# Patient Record
Sex: Male | Born: 1960
Health system: Southern US, Community
[De-identification: ages and names within clinical notes are randomized; demographics above are authoritative.]

## PROBLEM LIST (undated history)

## (undated) DIAGNOSIS — K219 Gastro-esophageal reflux disease without esophagitis: Secondary | ICD-10-CM

## (undated) DIAGNOSIS — M7501 Adhesive capsulitis of right shoulder: Secondary | ICD-10-CM

## (undated) DIAGNOSIS — K648 Other hemorrhoids: Secondary | ICD-10-CM

## (undated) DIAGNOSIS — J309 Allergic rhinitis, unspecified: Secondary | ICD-10-CM

## (undated) DIAGNOSIS — E119 Type 2 diabetes mellitus without complications: Secondary | ICD-10-CM

## (undated) HISTORY — PX: HIP SURGERY: SHX245

## (undated) HISTORY — DX: Allergic rhinitis, unspecified: J30.9

## (undated) HISTORY — DX: Other hemorrhoids: K64.8

## (undated) HISTORY — DX: Gastro-esophageal reflux disease without esophagitis: K21.9

## (undated) HISTORY — PX: EYE SURGERY: SHX253

## (undated) HISTORY — DX: Type 2 diabetes mellitus without complications: E11.9

---

## 1997-08-26 ENCOUNTER — Encounter: Payer: Self-pay | Admitting: Internal Medicine

## 2002-01-22 ENCOUNTER — Ambulatory Visit (HOSPITAL_COMMUNITY): Admission: RE | Admit: 2002-01-22 | Discharge: 2002-01-22 | Payer: Self-pay | Admitting: Internal Medicine

## 2002-01-22 ENCOUNTER — Encounter: Payer: Self-pay | Admitting: Internal Medicine

## 2002-02-18 ENCOUNTER — Encounter: Payer: Self-pay | Admitting: Orthopedic Surgery

## 2002-02-18 ENCOUNTER — Ambulatory Visit (HOSPITAL_COMMUNITY): Admission: RE | Admit: 2002-02-18 | Discharge: 2002-02-18 | Payer: Self-pay | Admitting: Orthopedic Surgery

## 2002-05-18 ENCOUNTER — Encounter: Payer: Self-pay | Admitting: Orthopedic Surgery

## 2002-05-18 ENCOUNTER — Ambulatory Visit (HOSPITAL_COMMUNITY): Admission: RE | Admit: 2002-05-18 | Discharge: 2002-05-18 | Payer: Self-pay | Admitting: Orthopedic Surgery

## 2004-05-11 ENCOUNTER — Ambulatory Visit: Payer: Self-pay | Admitting: Pulmonary Disease

## 2007-04-15 ENCOUNTER — Emergency Department (HOSPITAL_COMMUNITY): Admission: EM | Admit: 2007-04-15 | Discharge: 2007-04-15 | Payer: Self-pay | Admitting: Emergency Medicine

## 2007-10-17 ENCOUNTER — Ambulatory Visit: Payer: Self-pay | Admitting: Internal Medicine

## 2007-10-17 LAB — CONVERTED CEMR LAB
BUN: 17 mg/dL (ref 6–23)
Basophils Absolute: 0 10*3/uL (ref 0.0–0.1)
Basophils Relative: 0.4 % (ref 0.0–1.0)
CO2: 29 meq/L (ref 19–32)
Calcium: 9.4 mg/dL (ref 8.4–10.5)
Chloride: 105 meq/L (ref 96–112)
Cholesterol: 242 mg/dL (ref 0–200)
Creatinine, Ser: 1.3 mg/dL (ref 0.4–1.5)
Direct LDL: 188.9 mg/dL
Eosinophils Absolute: 0.2 10*3/uL (ref 0.0–0.7)
Eosinophils Relative: 3 % (ref 0.0–5.0)
GFR calc Af Amer: 76 mL/min
GFR calc non Af Amer: 63 mL/min
Glucose, Bld: 118 mg/dL — ABNORMAL HIGH (ref 70–99)
HCT: 48.1 % (ref 39.0–52.0)
HDL: 32.7 mg/dL — ABNORMAL LOW (ref >39.0)
Hemoglobin: 15.9 g/dL (ref 13.0–17.0)
Hgb A1c MFr Bld: 6.5 % — ABNORMAL HIGH (ref 4.6–6.0)
Lymphocytes Relative: 30.2 % (ref 12.0–46.0)
MCHC: 33.1 g/dL (ref 30.0–36.0)
MCV: 86.7 fL (ref 78.0–100.0)
Monocytes Absolute: 0.4 10*3/uL (ref 0.1–1.0)
Monocytes Relative: 7.2 % (ref 3.0–12.0)
Neutro Abs: 3.4 10*3/uL (ref 1.4–7.7)
Neutrophils Relative %: 59.2 % (ref 43.0–77.0)
PSA: 0.79 ng/mL (ref 0.10–4.00)
Platelets: 209 10*3/uL (ref 150–400)
Potassium: 4 meq/L (ref 3.5–5.1)
RBC: 5.54 M/uL (ref 4.22–5.81)
RDW: 12.5 % (ref 11.5–14.6)
Sodium: 141 meq/L (ref 135–145)
TSH: 3.14 microintl units/mL (ref 0.35–5.50)
Total CHOL/HDL Ratio: 7.4
Triglycerides: 130 mg/dL (ref 0–149)
VLDL: 26 mg/dL (ref 0–40)
WBC: 5.8 10*3/uL (ref 4.5–10.5)

## 2007-10-18 ENCOUNTER — Encounter: Payer: Self-pay | Admitting: Internal Medicine

## 2008-09-17 ENCOUNTER — Ambulatory Visit: Payer: Self-pay | Admitting: Internal Medicine

## 2008-09-17 LAB — CONVERTED CEMR LAB: Hgb A1c MFr Bld: 6.1 % (ref 4.6–6.5)

## 2009-10-17 DIAGNOSIS — Z8719 Personal history of other diseases of the digestive system: Secondary | ICD-10-CM

## 2009-10-17 DIAGNOSIS — K6289 Other specified diseases of anus and rectum: Secondary | ICD-10-CM | POA: Insufficient documentation

## 2009-10-17 DIAGNOSIS — K219 Gastro-esophageal reflux disease without esophagitis: Secondary | ICD-10-CM

## 2009-10-17 DIAGNOSIS — K625 Hemorrhage of anus and rectum: Secondary | ICD-10-CM

## 2009-10-20 ENCOUNTER — Ambulatory Visit: Payer: Self-pay | Admitting: Internal Medicine

## 2009-10-31 ENCOUNTER — Ambulatory Visit: Payer: Self-pay | Admitting: Internal Medicine

## 2009-10-31 ENCOUNTER — Ambulatory Visit (HOSPITAL_COMMUNITY): Admission: RE | Admit: 2009-10-31 | Discharge: 2009-10-31 | Payer: Self-pay | Admitting: Internal Medicine

## 2010-07-14 NOTE — Letter (Signed)
Summary: [facility] Instructions  [facility]  [address] [place], [place] [address]   Phone: [phone]  Fax: [phone]       MURLIN SCHRIEBER    1960-08-24    MRN: [id number]       Procedure Day /Date: 10/31/09 Friday     Arrival Time: 11:30 am     Procedure Time: 12:30 pm     Location of Procedure:                     _x _  [facility] ( Outpatient Registration)     PREPARATION FOR COLONOSCOPY WITH MIRALAX  Starting 5 days prior to your procedure (10/26/09) do not eat nuts, seeds, popcorn, corn, beans, peas,  salads, or any raw vegetables.  Do not take any fiber supplements (e.g. Metamucil, Citrucel, and Benefiber). ____________________________________________________________________________________________________   THE DAY BEFORE YOUR PROCEDURE         DATE: 10/30/09 DAY: Thursday  1   Drink clear liquids the entire day-NO SOLID FOOD  2   Do not drink anything colored red or purple.  Avoid juices with pulp.  No orange juice.  3   Drink at least 64 oz. (8 glasses) of fluid/clear liquids during the day to prevent dehydration and help the prep work efficiently.  CLEAR LIQUIDS INCLUDE: Water Jello Ice Popsicles Tea (sugar ok, no milk/cream) Powdered fruit flavored drinks Coffee (sugar ok, no milk/cream) Gatorade Juice: apple, white grape, white cranberry  Lemonade Clear bullion, consomm, broth Carbonated beverages (any kind) Strained chicken noodle soup Hard Candy  4   Mix the entire bottle of Miralax with 64 oz. of Gatorade/Powerade in the morning and put in the refrigerator to chill.  5   At 3:00 pm take 2 Dulcolax/Bisacodyl tablets.  6   At 4:30 pm take one Reglan/Metoclopramide tablet.  7  Starting at 5:00 pm drink one 8 oz glass of the Miralax mixture every 15-20 minutes until you have finished drinking the entire 64 oz.  You should finish drinking prep around 7:30 or 8:00 pm.  8   If you are nauseated, you may take the 2nd  Reglan/Metoclopramide tablet at 6:30 pm.           9    At 8:00 pm take 2 more DULCOLAX/Bisacodyl tablets.     THE DAY OF YOUR PROCEDURE      DATE:  10/31/09 DAY: Friday  You may drink clear liquids until 10:30 am  (2 HOURS BEFORE PROCEDURE).   MEDICATION INSTRUCTIONS  Unless otherwise instructed, you should take regular prescription medications with a small sip of water as early as possible the morning of your procedure.         OTHER INSTRUCTIONS  You will need a responsible adult at least 51 years of age to accompany you and drive you home.   This person must remain in the waiting room during your procedure.  Wear loose fitting clothing that is easily removed.  Leave jewelry and other valuables at home.  However, you may wish to bring a book to read or an iPod/MP3 player to listen to music as you wait for your procedure to start.  Remove all body piercing jewelry and leave at home.  Total time from sign-in until discharge is approximately 2-3 hours.  You should go home directly after your procedure and rest.  You can resume normal activities the day after your procedure.  The day of your procedure you should not:  Drive   Make legal decisions   Operate machinery   Drink alcohol   Return to work  You will receive specific instructions about eating, activities and medications before you leave.   The above instructions have been reviewed and explained to me by   Lamona Curl CMA Duncan Dull)  Oct 20, 2009 12:45 PM     I fully understand and can verbalize these instructions _____________________________ Date 10/20/09

## 2010-07-14 NOTE — Procedures (Signed)
Summary: Instruction for procedure/[facility] (out pt)  Instruction for procedure/[facility] (out pt)   Imported By: Sherian Rein 10/23/2009 13:13:31  _____________________________________________________________________  External Attachment:    Type:   Image     Comment:   External Document

## 2010-07-14 NOTE — Procedures (Signed)
Summary: Colonoscopy  Patient: [name] Note: All result statuses are Final unless otherwise noted.  Tests: (1) Colonoscopy (COL)   COL Colonoscopy           DONE     [facility]     [address] [place], [place]  [address]           COLONOSCOPY PROCEDURE REPORT           PATIENT:  Terry, Benson  MR#:  [id number]     BIRTHDATE:  06-Feb-1961, 48 yrs. old  GENDER:  male     ENDOSCOPIST:  Hedwig Morton. Juanda Chance, MD     REF. BY:  Rosalyn Gess. Norins, M.D.     PROCEDURE DATE:  10/31/2009     PROCEDURE:  Colonoscopy [address]27[address]     ASA CLASS:  Class I     INDICATIONS:  Routine Risk Screening hematochezia, anoscopic exam     confirms first grade hemmorrhoids     MEDICATIONS:   Versed 5 mg, Fentanyl 75 mcg           DESCRIPTION OF PROCEDURE:   After the risks benefits and     alternatives of the procedure were thoroughly explained, informed     consent was obtained.  Digital rectal exam was performed and     revealed no abnormalities.   The  endoscope was introduced through     the anus and advanced to the cecum, which was identified by both     the appendix and ileocecal valve, without limitations.  The     quality of the prep was excellent, using MiraLax.  The instrument     was then slowly withdrawn as the colon was fully examined.     <<PROCEDUREIMAGES>>           FINDINGS:  Internal hemorrhoids were found (see image11, image12,     image13, and image14). small first grade hems x 3, also tiny     erosions within anal canal, no definite fissure, no prolapse  Mild     diverticulosis was found (see image9 and image2).  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 2 hemangiomas, no stigmata of bleeding  This was     otherwise a normal examination of the colon (see image6, image5,     image4, and image3).   Retroflexed views in the rectum revealed no     abnormalities.    The scope was then withdrawn from the patient     and the  procedure completed.           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           ______________________________     Hedwig Morton. Juanda Chance,  MD           CC:           n.     eSIGNED:   Ariadne Rissmiller M. Anisa Benson at 10/31/2009 01:16 PM           Ancelmo, Hunt, [id number]  Note: An exclamation mark (!) indicates a result that was not dispersed into the flowsheet. Document Creation Date: 10/31/2009 1:54 PM _______________________________________________________________________  (1) Order result status: Final Collection or observation date-time: 10/31/2009 13:06 Requested date-time:  Receipt date-time:  Reported date-time:  Referring Physician:   Ordering Physician: Lina Sar [phone]) Specimen Source:  Source: Launa Grill Order Number: [id number] Lab site:   Appended Document: Colonoscopy Recall is in IDX for 10/2019.

## 2010-07-14 NOTE — Assessment & Plan Note (Signed)
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 He has had a one year history of rectal bleeding and rectal pain which is worse when sitting and sometimes when he is having a bowel movement. He has some difficulty with evacuation. He is very active physically not limited to work but he also bikes. He is  concerned about the possibly of hemorrhoids, anal fissure or possibly coccydynia. He has a tearing sensation with bowel movements. There is no family history of colon cancer. He has never had a colonoscopy. He denies any abdominal pain. Patient does have a history significant for gastroesophageal reflux and Grade 2 erosive  esophagitis initially evaluated in 1999. Since his intentional weight loss, the reflux symptoms have resolved and he is currently on no acid reducing agents.   GI Review of Systems      Denies abdominal pain, acid reflux, belching, bloating, chest pain, dysphagia with liquids, dysphagia with solids, heartburn, loss of appetite, nausea, vomiting, vomiting blood, weight loss, and  weight gain.      Reports change in bowel habits, constipation, hemorrhoids, and  rectal bleeding.     Denies anal fissure, black tarry stools, diarrhea, diverticulosis, fecal incontinence, heme positive stool, irritable bowel syndrome, jaundice, light color stool, liver problems, and  rectal pain.    Current Medications (verified): 1)  None  Allergies (verified): No Known Drug Allergies  Past History:  Past Medical History: Current Problems:  REFLUX ESOPHAGITIS, HX OF (ICD-V12.79) GERD (ICD-530.81) RECTAL PAIN (ICD-569.42) RECTAL BLEEDING (ICD-569.3)  Hyperlipidemia  Past Surgical History: Left Hip surgery Eye Surgery Left x 2  Family History: Family History of Colon Cancer: MGF-dx'd  in 70's, Pat Aunt dx'd/deceased in 25's  Social History: Married, 1 boy, 1 girl Occupation: Producer, television/film/video Patient has never smoked.  Illicit Drug Use - no Daily Caffeine Use 3 cups/coffee  Review of Systems       The patient complains of back pain.  The patient denies allergy/sinus, anemia, anxiety-new, arthritis/joint pain, blood in urine, breast changes/lumps, confusion, cough, coughing up blood, depression-new, fainting, fatigue, fever, headaches-new, hearing problems, heart murmur, heart rhythm changes, itching, muscle pains/cramps, night sweats, nosebleeds, shortness of breath, skin rash, sleeping problems, sore throat, swelling of feet/legs, swollen lymph glands, thirst - excessive, urination - excessive, urination changes/pain, urine leakage, vision changes, and voice change.         Pertinent positive and negative review of systems were noted in the above HPI. All other ROS was otherwise negative.   Vital Signs:  Patient profile:   50 year old male Height:      69 inches Weight:      187 pounds BMI:     27.71 Pulse rate:   68 / minute Pulse rhythm:   regular BP sitting:   104 / 62  (left arm) Cuff size:   regular  Vitals Entered By: Francee Piccolo CMA Duncan Dull) (Oct 20, 2009 12:54 PM)  Physical Exam  General:  Well developed, well nourished, no acute distress. Lungs:  Clear throughout to auscultation. Heart:  Regular rate and rhythm; no murmurs, rubs,  or bruits. Abdomen:  Soft, nontender and nondistended. No masses, hepatosplenomegaly or hernias noted. Normal bowel sounds. Rectal:  rectal and anoscopic exam reveals a normal perianal area with a small skin tag protruding at  7:00 the anal   canal is normal. Rectal tone is unremarkable. There were circumferential ,first-grade hemorrhoids, specifically one at 3:00 which is hyperemic, bluish and quite edematous. There was no prolapse of the hemorrhoids. The mucosa of the rectal ampulla appears normal. Stool  is Hemoccult negative. Coccyx was slightly tender. Extremities:  No clubbing, cyanosis, edema or deformities noted. Skin:  Intact without significant lesions or rashes. Psych:  Alert and cooperative. Normal mood and affect.   Impression & Recommendations:  Problem # 1:  RECTAL PAIN (ZOX-096.04)  Patient's rectal pain is most likely associated with symptomatic first grade internal  hemorrhoids, there is no evidence of anal fissure. There is some tenderness of the coccyx as well and this could be due to rectal spasm. Because of the intermittent bleeding and his age of 89 we will proceed with a colonoscopy and depending on the assessment, we would also consider banding of the hemorrhoids. In the meantime, he will use Anusol-HC suppositories once a day and Analpram cream p.r.n. rectal irritation. He may also use dicolfenac 75 mg p.r.n. rectal and coccygeal discomfort and stool softeners p.r.n.  Orders: ZCOL Banding (ZCOL Band)  Problem # 2:  REFLUX ESOPHAGITIS, HX OF (ICD-V12.79) Patient is currently asymptomatic since his massive weight loss. He is on no medications at this time.  Patient Instructions: 1)  You have been scheduled for a colonoscopy at [facility] registration. Please arrive at 11:30 am for your 12:30 pm appointment. 2)  Please pick up your Miralax prep and anusol suppositories at the pharmacy. We have already sent prescriptions electronically for you. 3)  A hemorrhoid handout has been given to you. 4)  The medication list was reviewed and reconciled.  All changed / newly prescribed medications were explained.  A complete medication list was provided to the patient / caregiver. Prescriptions: DICLOFENAC SODIUM 75 MG TBEC (DICLOFENAC SODIUM) Take 1 tablet by mouth once daily  #30 x 3   Entered by:   Lamona Curl CMA (AAMA)   Authorized by:   Hart Carwin MD   Signed by:   Lamona Curl CMA (AAMA) on 10/20/2009   Method used:   Electronically to         [facility] [address] [phone]* (retail)       [address]       [place], [place]  [address]       Ph: [phone] or [phone]       Fax: [phone]   RxID:   [id number] ANUSOL-HC 25 MG SUPP (HYDROCORTISONE ACETATE) Insert 1 suppository into the rectum at bedtime  #12 x 3   Entered by:   Lamona Curl CMA (AAMA)   Authorized by:   Hart Carwin MD   Signed by:   Lamona Curl CMA (AAMA) on 10/20/2009   Method used:   Electronically to        [facility] [address] [phone]* (retail)       [address]       [place], [place]  [address]       Ph: [phone] or [phone]       Fax: [phone]   RxID:   [id number] REGLAN 10 MG  TABS (METOCLOPRAMIDE HCL) As per prep instructions.  #2 x 0   Entered by:   Lamona Curl CMA (AAMA)   Authorized by:   Hart Carwin MD  Signed by:   Lamona Curl CMA (AAMA) on 10/20/2009   Method used:   Electronically to        [facility] [address] [phone]* (retail)       [address]       [place], [place]  [address]       Ph: [phone] or [phone]       Fax: [phone]   RxID:   [id number] DULCOLAX 5 MG  TBEC (BISACODYL) Day before procedure take 2 at 3pm and 2 at 8pm.  #4 x 0   Entered by:   Lamona Curl CMA (AAMA)   Authorized by:   Hart Carwin MD   Signed by:   Lamona Curl CMA (AAMA) on 10/20/2009   Method used:   Electronically to        [facility] [address] [phone]* (retail)       [address]       [place], [place]  [address]       Ph: [phone] or [phone]       Fax: [phone]   RxID:   [id number]

## 2010-07-14 NOTE — Procedures (Signed)
Summary: EGD/[facility]  EGD/[facility]   Imported By: Sherian Rein 10/20/2009 09:46:17  _____________________________________________________________________  External Attachment:    Type:   Image     Comment:   External Document

## 2010-10-30 NOTE — Op Note (Signed)
NAMECHAUN, UEMURA NO.:  [id number]   MEDICAL RECORD NO.:  [id number]                   PATIENT TYPE:  OIB   LOCATION:  2899                                 FACILITY:  [facility]   PHYSICIAN:  Ollen Gross, M.D.                 DATE OF BIRTH:  1960/10/22   DATE OF PROCEDURE:  05/18/2002  DATE OF DISCHARGE:                                 OPERATIVE REPORT   PREOPERATIVE DIAGNOSES:  Left hip labral tear.   POSTOPERATIVE DIAGNOSES:  Left hip labral tear.  Chondral defect of the  acetabulum.   PROCEDURE:  Left hip arthroscopy with labral debridement and chondroplasty.   SURGEON:  Ollen Gross, M.D.   ASSISTANT:  Alexzandrew L. Julien Girt, P.A.   ANESTHESIA:  General.   ESTIMATED BLOOD LOSS:  Minimal.   DRAINS:  None.   COMPLICATIONS:  None.   DISPOSITION:  To recovery.   CLINICAL NOTE:  Terry Benson is a 50 year old male with a several month history of  significant left hip pain and mechanical-type symptoms. He gets a catching  in his groin with external rotation.  Exam and history suggests a labral  tear confirmed by MRI.  He presents now for hip arthroscopy and labral  debridement.   PROCEDURE IN DETAIL:  After successful administration of general anesthetic,  the patient was placed in the right lateral decubitus position with the left  side up with his lower leg well-padded.  The peroneal post is then placed,  and the peroneal post was well-padded.  The left lower extremity was draped  over the perineal post and the left foot well-padded and placed into the  traction boot.  Under fluoroscopic guidance, traction was applied until the  appropriate amount of distraction was present across the hip joint.  The hip  was then prepped and draped in the usual sterile fashion.  The spinal  needles were used to localize the anterior and posterior peritrochanteric  portals.  Once those were identified and were found to be in the joint, then  saline was  injected through the posterior needle and shown to egress through  the anterior needle, confirming that they were both intra-articular.  Fluoro  was then removed.  A wire was then passed through the posterior needle.  A  small incision was made around the wire.  A 5 mm dilator was placed over the  wire and then a 5 mm cannula was placed.  The camera was then passed into  the joint.  Arthroscopic visualization proceeded.  The fovea appeared  normal.  The femoral head appeared normal throughout.  The posterior and  superior acetabulum all looked normal.  Along the anterior acetabulum, there  appears to be in a wrinkle in the cartilage at the chondral labral junction.  There was also a small tear in the anterior-inferior labrum.  This  corresponds to the area of abnormality on  the MRI.  The needle anteriorly  was found to be in good position, and we then placed the wire.  We removed  the needle and made a small incision and put the 5 and 7 mm dilators and 7  mm cannula anteriorly.  A probe was placed, and he had a chondral  delamination from the anterior-inferior acetabulum, about a 1 x 2 cm lesion.  This was debrided back to a stable bony base with the long shaver and the  ArthroCare.  We probed again, and everything was found to be stable.  The  labral tear was also debrided and stabilized. The joints were again  reinspected, and there were no loose bodies.  Once the debridement was  completed, then the arthroscopic instruments were removed from the posterior  portal, and 20 cc of 0.25% Marcaine with epinephrine were injected through  the cannula, which was subsequently removed.  Traction was released, and the  hip was confirmed to be reduced with a fluoro spot.  The incision was then  closed with interrupted 4-0 nylon.  A bulky, sterile dressing was applied.  The incision was then closed with interrupted 4-0 nylon.  A bulky, sterile  dressing was applied.  He was awakened and transported to  recovery in a  stable condition.                                               Ollen Gross, M.D.    FA/MEDQ  D:  05/18/2002  T:  05/19/2002  Job:  [id number]

## 2011-02-24 ENCOUNTER — Ambulatory Visit: Payer: Self-pay | Admitting: *Deleted

## 2012-05-08 ENCOUNTER — Ambulatory Visit (HOSPITAL_COMMUNITY)
Admission: RE | Admit: 2012-05-08 | Discharge: 2012-05-08 | Disposition: A | Discharge status: Home / Self Care | Payer: 59 | Source: Ambulatory Visit | Attending: Neurosurgery | Admitting: Neurosurgery

## 2012-05-08 ENCOUNTER — Other Ambulatory Visit (HOSPITAL_COMMUNITY): Payer: Self-pay | Admitting: Neurosurgery

## 2012-05-08 DIAGNOSIS — M542 Cervicalgia: Secondary | ICD-10-CM

## 2013-07-23 ENCOUNTER — Other Ambulatory Visit: Payer: Self-pay | Admitting: *Deleted

## 2013-07-23 DIAGNOSIS — Z0000 Encounter for general adult medical examination without abnormal findings: Secondary | ICD-10-CM

## 2013-07-25 ENCOUNTER — Other Ambulatory Visit (INDEPENDENT_AMBULATORY_CARE_PROVIDER_SITE_OTHER): Payer: 59

## 2013-07-25 DIAGNOSIS — Z0000 Encounter for general adult medical examination without abnormal findings: Secondary | ICD-10-CM

## 2013-07-25 LAB — HEPATIC FUNCTION PANEL
ALBUMIN: 4.5 g/dL (ref 3.5–5.2)
ALT: 22 U/L (ref 0–53)
AST: 22 U/L (ref 0–37)
Alkaline Phosphatase: 64 U/L (ref 39–117)
Bilirubin, Direct: 0.1 mg/dL (ref 0.0–0.3)
Total Bilirubin: 1 mg/dL (ref 0.3–1.2)
Total Protein: 7 g/dL (ref 6.0–8.3)

## 2013-07-25 LAB — CBC WITH DIFFERENTIAL/PLATELET
BASOS PCT: 0.2 % (ref 0.0–3.0)
Basophils Absolute: 0 10*3/uL (ref 0.0–0.1)
EOS ABS: 0.1 10*3/uL (ref 0.0–0.7)
Eosinophils Relative: 1.8 % (ref 0.0–5.0)
HEMATOCRIT: 48.8 % (ref 39.0–52.0)
HEMOGLOBIN: 16 g/dL (ref 13.0–17.0)
LYMPHS ABS: 1.4 10*3/uL (ref 0.7–4.0)
LYMPHS PCT: 19.3 % (ref 12.0–46.0)
MCHC: 32.8 g/dL (ref 30.0–36.0)
MCV: 88.4 fl (ref 78.0–100.0)
MONO ABS: 0.6 10*3/uL (ref 0.1–1.0)
Monocytes Relative: 8 % (ref 3.0–12.0)
NEUTROS ABS: 5.3 10*3/uL (ref 1.4–7.7)
Neutrophils Relative %: 70.7 % (ref 43.0–77.0)
Platelets: 222 10*3/uL (ref 150.0–400.0)
RBC: 5.52 Mil/uL (ref 4.22–5.81)
RDW: 13.8 % (ref 11.5–14.6)
WBC: 7.5 10*3/uL (ref 4.5–10.5)

## 2013-07-25 LAB — BASIC METABOLIC PANEL
BUN: 21 mg/dL (ref 6–23)
CALCIUM: 9.5 mg/dL (ref 8.4–10.5)
CHLORIDE: 109 meq/L (ref 96–112)
CO2: 26 mEq/L (ref 19–32)
CREATININE: 1.4 mg/dL (ref 0.4–1.5)
GFR: 55.34 mL/min — ABNORMAL LOW (ref >60.00)
Glucose, Bld: 112 mg/dL — ABNORMAL HIGH (ref 70–99)
Potassium: 4.2 mEq/L (ref 3.5–5.1)
Sodium: 142 mEq/L (ref 135–145)

## 2013-07-25 LAB — LIPID PANEL
CHOL/HDL RATIO: 6
CHOLESTEROL: 233 mg/dL — AB (ref 0–200)
HDL: 42.3 mg/dL (ref >39.00)
Triglycerides: 99 mg/dL (ref 0.0–149.0)
VLDL: 19.8 mg/dL (ref 0.0–40.0)

## 2013-07-25 LAB — LDL CHOLESTEROL, DIRECT: Direct LDL: 180.7 mg/dL

## 2013-07-25 LAB — PSA: PSA: 0.74 ng/mL (ref 0.10–4.00)

## 2013-07-25 LAB — HEMOGLOBIN A1C: Hgb A1c MFr Bld: 6.3 % (ref 4.6–6.5)

## 2013-08-24 ENCOUNTER — Ambulatory Visit (INDEPENDENT_AMBULATORY_CARE_PROVIDER_SITE_OTHER): Payer: 59 | Admitting: Internal Medicine

## 2013-08-24 VITALS — BP 120/88 | Resp 12 | Wt 199.0 lb

## 2013-08-24 DIAGNOSIS — H6983 Other specified disorders of Eustachian tube, bilateral: Secondary | ICD-10-CM

## 2013-08-24 DIAGNOSIS — Z789 Other specified health status: Secondary | ICD-10-CM

## 2013-08-24 DIAGNOSIS — F17200 Nicotine dependence, unspecified, uncomplicated: Secondary | ICD-10-CM

## 2013-08-24 DIAGNOSIS — K625 Hemorrhage of anus and rectum: Secondary | ICD-10-CM

## 2013-08-24 DIAGNOSIS — H6990 Unspecified Eustachian tube disorder, unspecified ear: Secondary | ICD-10-CM

## 2013-08-24 DIAGNOSIS — J309 Allergic rhinitis, unspecified: Secondary | ICD-10-CM

## 2013-08-24 DIAGNOSIS — J329 Chronic sinusitis, unspecified: Secondary | ICD-10-CM

## 2013-08-24 DIAGNOSIS — H6980 Other specified disorders of Eustachian tube, unspecified ear: Secondary | ICD-10-CM

## 2013-08-24 DIAGNOSIS — Z8719 Personal history of other diseases of the digestive system: Secondary | ICD-10-CM

## 2013-08-24 DIAGNOSIS — H8309 Labyrinthitis, unspecified ear: Secondary | ICD-10-CM

## 2013-08-24 DIAGNOSIS — H6993 Unspecified Eustachian tube disorder, bilateral: Secondary | ICD-10-CM

## 2013-08-24 DIAGNOSIS — Z720 Tobacco use: Secondary | ICD-10-CM

## 2013-08-24 NOTE — Patient Instructions (Addendum)
Good to see you.  Chronic rhinitis - allergic in nature. Plan Trial of nasal inhalational steroids - sample of nasonex 2 sprays to each nostril once a day. If this works you can use otc Flonase 1 or 2 sprays once a day. After you have been on this for a while you can drop back to 3 times a week. Be sure to gargle after use of the spray   Labyrinthitis  - your symptoms a more consistent with this diagnosis, especially since your respond to antivert.  Chronic eustachian tube dysfunction - consult with ENT, may need myringotomy tubes.  Health maintenance - please avoid nicotine. Check with Sydell Axon about EGD. Good for colonoscopy until 2021. Immunizations - consider Tdap, ? Pneumonia vaccines: prevnar first then pneumovax after 8 weeks.

## 2013-08-26 ENCOUNTER — Encounter: Payer: Self-pay | Admitting: Internal Medicine

## 2013-08-26 DIAGNOSIS — J329 Chronic sinusitis, unspecified: Secondary | ICD-10-CM | POA: Insufficient documentation

## 2013-08-26 DIAGNOSIS — H8309 Labyrinthitis, unspecified ear: Secondary | ICD-10-CM | POA: Insufficient documentation

## 2013-08-26 DIAGNOSIS — J309 Allergic rhinitis, unspecified: Secondary | ICD-10-CM | POA: Insufficient documentation

## 2013-08-26 DIAGNOSIS — H6983 Other specified disorders of Eustachian tube, bilateral: Secondary | ICD-10-CM | POA: Insufficient documentation

## 2013-08-26 DIAGNOSIS — Z789 Other specified health status: Secondary | ICD-10-CM

## 2013-08-26 DIAGNOSIS — Z720 Tobacco use: Secondary | ICD-10-CM | POA: Insufficient documentation

## 2013-08-26 NOTE — Assessment & Plan Note (Signed)
Several episodes over the last year of dysequilibrium, at times with nausea, that has responded to meclizine. No focal neurologic symptoms  Plan No indication for neuro imaging  Continue meclizine prn  For failure to respond or worsening symptoms - neuro imaging and vestibular rehab.

## 2013-08-26 NOTE — Progress Notes (Signed)
   Subjective:    Patient ID: Terry Benson, male    DOB: 01-09-1961, 53 y.o.   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 He feels that his ears are pressurized a majority of the time. He has had only partial relief with oral decongestants. He is taking claritin on a frequent basis for symptoms c/w allergic rhinitis.  He reports that he has had several episodes of vertigo with nausea. This can be close to debilitating. On questioning he endorses that his symptoms are more of a dysequilibrium rather than true vertigo. He has taken meclizine with good results.  He uses oral tobacco products on occasion and has noted a palpable abnormality in the left sub-mental area.  He has had a history of GERD and is concerned that he is overdue for follow up EGD to rule out Barrett's esophagus.  Past Medical History  Diagnosis Date  . GERD (gastroesophageal reflux disease)   . Internal hemorrhoid     by colonoscopy 2011  . Allergic rhinitis    Past Surgical History  Procedure Laterality Date  . Eye surgery      x 2  . Hip surgery     Family History  Problem Relation Age of Onset  . Cancer Paternal Aunt     colon  . Cancer Maternal Grandfather     colon   History   Social History  . Marital Status: Married    Spouse Name: N/A    Number of Children: N/A  . Years of Education: N/A   Occupational History  . Not on file.   Social History Main Topics  . Smoking status: Never Smoker   . Smokeless tobacco: Current User  . Alcohol Use: No  . Drug Use: Not on file  . Sexual Activity: Yes    Partners: Female   Other Topics Concern  . 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   Incomplete history - needs fleshing out at next PCP encounter.   No current outpatient prescriptions on file prior to visit.   No current  facility-administered medications on file prior to visit.   OTC use - claritin frequently  Sudafed as needed.      Review of Systems System review is negative for any constitutional, cardiac, pulmonary, GI or neuro symptoms or complaints other than as described in the HPI.      Objective:   Physical Exam Filed Vitals:   08/24/13 1218  BP: 120/88  Resp: 12   Gen'l- WNWD man in no distress HEENT - EACs clear, TMs appear normal Nodes - no submandibular or submental node(s). Linear firmness that is more tendon like. Neuro - Awake and alert, normal cognition.       Assessment & Plan:

## 2013-08-26 NOTE — Assessment & Plan Note (Signed)
H/o erosive esophagitis. On no medications but concerned about possible progression to Barrett's.  Plan He will contact Dr. Olevia Perches in regard to follow up study.

## 2013-08-26 NOTE — Assessment & Plan Note (Signed)
Chronic sinus pressure/congestion but no purulent rhinorrhea, fever or sinus pain.  Plan ENT consultation.

## 2013-08-26 NOTE — Assessment & Plan Note (Signed)
Based on history of symptoms with drainage, congestion w/o infection.  Plan Nasal inhalational steroid spray - starting with sample of Nasonex and if helpful changing to otc fluticasone.

## 2013-08-26 NOTE — Assessment & Plan Note (Signed)
History of symptoms very c/w increased middle ear pressure 2/2 eustachian tube dysfunction. Oral decongestants offer only partial relief.  Plan ENT consult

## 2013-08-26 NOTE — Assessment & Plan Note (Signed)
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 He is aware of the risks of nicotine use.  Plan He is encouraged to d/c use but he is only contemplative at this time, not prepared to stop.

## 2013-09-07 ENCOUNTER — Telehealth: Payer: Self-pay | Admitting: Internal Medicine

## 2013-09-07 NOTE — Telephone Encounter (Signed)
Rec'd from [facility] forward 3 pages to Dr. Norins °

## 2013-09-11 ENCOUNTER — Telehealth: Payer: Self-pay | Admitting: Internal Medicine

## 2013-09-11 NOTE — Telephone Encounter (Signed)
Rec'd from [facility] forward 4 pages to Dr.norins

## 2013-09-11 NOTE — Telephone Encounter (Signed)
Rec'd from [facility] forward 4 pages to Dr.Norins ° °

## 2013-10-15 ENCOUNTER — Encounter: Payer: Self-pay | Admitting: Cardiovascular Disease

## 2014-02-22 ENCOUNTER — Ambulatory Visit: Payer: 59 | Attending: Cardiovascular Disease | Admitting: Rehabilitative and Restorative Service Providers"

## 2014-02-22 DIAGNOSIS — IMO0001 Reserved for inherently not codable concepts without codable children: Secondary | ICD-10-CM | POA: Insufficient documentation

## 2014-02-22 DIAGNOSIS — H8110 Benign paroxysmal vertigo, unspecified ear: Secondary | ICD-10-CM | POA: Diagnosis not present

## 2014-02-27 ENCOUNTER — Encounter: Payer: 59 | Admitting: Rehabilitative and Restorative Service Providers"

## 2014-12-05 ENCOUNTER — Other Ambulatory Visit (HOSPITAL_COMMUNITY): Payer: Self-pay | Admitting: Orthopedic Surgery

## 2014-12-05 DIAGNOSIS — M25511 Pain in right shoulder: Secondary | ICD-10-CM

## 2014-12-17 ENCOUNTER — Ambulatory Visit (HOSPITAL_COMMUNITY)
Admission: RE | Admit: 2014-12-17 | Discharge: 2014-12-17 | Disposition: A | Discharge status: Home / Self Care | Payer: 59 | Source: Ambulatory Visit | Attending: Orthopedic Surgery | Admitting: Orthopedic Surgery

## 2014-12-17 DIAGNOSIS — M25511 Pain in right shoulder: Secondary | ICD-10-CM

## 2014-12-17 DIAGNOSIS — M7581 Other shoulder lesions, right shoulder: Secondary | ICD-10-CM | POA: Insufficient documentation

## 2014-12-17 DIAGNOSIS — M24011 Loose body in right shoulder: Secondary | ICD-10-CM | POA: Diagnosis not present

## 2014-12-17 MED ORDER — IOHEXOL 180 MG/ML  SOLN: 20.0000 mL | Freq: Once | INTRAMUSCULAR | Status: AC | PRN

## 2014-12-17 MED ORDER — LIDOCAINE HCL (PF) 1 % IJ SOLN: 2.0000 mL | Freq: Once | INTRAMUSCULAR | Status: DC

## 2014-12-17 MED ORDER — GADOBENATE DIMEGLUMINE 529 MG/ML IV SOLN: 5.0000 mL | Freq: Once | INTRAVENOUS | Status: AC | PRN

## 2015-01-08 ENCOUNTER — Other Ambulatory Visit: Payer: Self-pay | Admitting: *Deleted

## 2015-01-08 MED ORDER — TRAMADOL HCL 50 MG PO TABS: 50.0000 mg | ORAL_TABLET | Freq: Four times a day (QID) | ORAL | Status: DC | PRN

## 2015-01-08 MED ORDER — PREDNISONE 10 MG (21) PO TBPK: 10.0000 mg | ORAL_TABLET | Freq: Every day | ORAL | Status: DC

## 2015-06-04 ENCOUNTER — Encounter (HOSPITAL_BASED_OUTPATIENT_CLINIC_OR_DEPARTMENT_OTHER): Payer: Self-pay | Admitting: *Deleted

## 2015-06-06 ENCOUNTER — Ambulatory Visit (HOSPITAL_BASED_OUTPATIENT_CLINIC_OR_DEPARTMENT_OTHER): Payer: 59 | Admitting: Anesthesiology

## 2015-06-06 ENCOUNTER — Encounter (HOSPITAL_BASED_OUTPATIENT_CLINIC_OR_DEPARTMENT_OTHER)
Admission: RE | Disposition: A | Discharge status: Home / Self Care | Payer: Self-pay | Source: Ambulatory Visit | Attending: Orthopedic Surgery

## 2015-06-06 ENCOUNTER — Encounter (HOSPITAL_BASED_OUTPATIENT_CLINIC_OR_DEPARTMENT_OTHER): Payer: Self-pay | Admitting: Anesthesiology

## 2015-06-06 ENCOUNTER — Ambulatory Visit (HOSPITAL_BASED_OUTPATIENT_CLINIC_OR_DEPARTMENT_OTHER)
Admission: RE | Admit: 2015-06-06 | Discharge: 2015-06-06 | Disposition: A | Discharge status: Home / Self Care | Payer: 59 | Source: Ambulatory Visit | Attending: Orthopedic Surgery | Admitting: Orthopedic Surgery

## 2015-06-06 DIAGNOSIS — S43491A Other sprain of right shoulder joint, initial encounter: Secondary | ICD-10-CM | POA: Diagnosis present

## 2015-06-06 DIAGNOSIS — W19XXXA Unspecified fall, initial encounter: Secondary | ICD-10-CM | POA: Insufficient documentation

## 2015-06-06 DIAGNOSIS — K219 Gastro-esophageal reflux disease without esophagitis: Secondary | ICD-10-CM | POA: Insufficient documentation

## 2015-06-06 DIAGNOSIS — M7501 Adhesive capsulitis of right shoulder: Secondary | ICD-10-CM | POA: Diagnosis present

## 2015-06-06 HISTORY — PX: SHOULDER ARTHROSCOPY: SHX128

## 2015-06-06 HISTORY — DX: Adhesive capsulitis of right shoulder: M75.01

## 2015-06-06 SURGERY — ARTHROSCOPY, SHOULDER
Anesthesia: General | Site: Shoulder | Laterality: Right

## 2015-06-06 MED ORDER — ONDANSETRON HCL 4 MG/2ML IJ SOLN
INTRAMUSCULAR | Status: DC | PRN
  Administered 2015-06-06: 4 mg via INTRAVENOUS

## 2015-06-06 MED ORDER — OXYCODONE-ACETAMINOPHEN 5-325 MG PO TABS: 1.0000 | ORAL_TABLET | Freq: Four times a day (QID) | ORAL | Status: DC | PRN

## 2015-06-06 MED ORDER — MIDAZOLAM HCL 2 MG/2ML IJ SOLN
INTRAMUSCULAR | Status: AC
  Filled 2015-06-06: qty 2

## 2015-06-06 MED ORDER — LIDOCAINE HCL (CARDIAC) 20 MG/ML IV SOLN
INTRAVENOUS | Status: AC
  Filled 2015-06-06: qty 5

## 2015-06-06 MED ORDER — CEFAZOLIN SODIUM-DEXTROSE 2-3 GM-% IV SOLR
2.0000 g | INTRAVENOUS | Status: AC
  Administered 2015-06-06: 2 g via INTRAVENOUS

## 2015-06-06 MED ORDER — GLYCOPYRROLATE 0.2 MG/ML IJ SOLN: 0.2000 mg | Freq: Once | INTRAMUSCULAR | Status: DC | PRN

## 2015-06-06 MED ORDER — DEXAMETHASONE SODIUM PHOSPHATE 10 MG/ML IJ SOLN
INTRAMUSCULAR | Status: AC
  Filled 2015-06-06: qty 1

## 2015-06-06 MED ORDER — FENTANYL CITRATE (PF) 100 MCG/2ML IJ SOLN
INTRAMUSCULAR | Status: DC | PRN
  Administered 2015-06-06: 100 ug via INTRAVENOUS

## 2015-06-06 MED ORDER — MEPERIDINE HCL 25 MG/ML IJ SOLN: 6.2500 mg | INTRAMUSCULAR | Status: DC | PRN

## 2015-06-06 MED ORDER — SCOPOLAMINE 1 MG/3DAYS TD PT72: 1.0000 | MEDICATED_PATCH | Freq: Once | TRANSDERMAL | Status: DC | PRN

## 2015-06-06 MED ORDER — HYDROMORPHONE HCL 1 MG/ML IJ SOLN
0.2500 mg | INTRAMUSCULAR | Status: DC | PRN
  Administered 2015-06-06 (×2): 0.5 mg via INTRAVENOUS

## 2015-06-06 MED ORDER — ONDANSETRON HCL 4 MG/2ML IJ SOLN
INTRAMUSCULAR | Status: AC
  Filled 2015-06-06: qty 2

## 2015-06-06 MED ORDER — LIDOCAINE HCL (CARDIAC) 20 MG/ML IV SOLN
INTRAVENOUS | Status: DC | PRN
  Administered 2015-06-06: 100 mg via INTRAVENOUS

## 2015-06-06 MED ORDER — SODIUM CHLORIDE 0.9 % IR SOLN
Status: DC | PRN
  Administered 2015-06-06: 3000 mL

## 2015-06-06 MED ORDER — OXYCODONE HCL 5 MG PO TABS
ORAL_TABLET | ORAL | Status: AC
  Filled 2015-06-06: qty 1

## 2015-06-06 MED ORDER — SUCCINYLCHOLINE CHLORIDE 20 MG/ML IJ SOLN
INTRAMUSCULAR | Status: AC
  Filled 2015-06-06: qty 1

## 2015-06-06 MED ORDER — SENNA-DOCUSATE SODIUM 8.6-50 MG PO TABS: 2.0000 | ORAL_TABLET | Freq: Every day | ORAL | Status: DC

## 2015-06-06 MED ORDER — DEXAMETHASONE SODIUM PHOSPHATE 4 MG/ML IJ SOLN
INTRAMUSCULAR | Status: DC | PRN
  Administered 2015-06-06: 10 mg via INTRAVENOUS

## 2015-06-06 MED ORDER — ONDANSETRON HCL 4 MG PO TABS: 4.0000 mg | ORAL_TABLET | Freq: Three times a day (TID) | ORAL | Status: DC | PRN

## 2015-06-06 MED ORDER — SUCCINYLCHOLINE CHLORIDE 20 MG/ML IJ SOLN
INTRAMUSCULAR | Status: DC | PRN
  Administered 2015-06-06: 50 mg via INTRAVENOUS

## 2015-06-06 MED ORDER — FENTANYL CITRATE (PF) 100 MCG/2ML IJ SOLN
INTRAMUSCULAR | Status: AC
  Filled 2015-06-06: qty 2

## 2015-06-06 MED ORDER — FENTANYL CITRATE (PF) 100 MCG/2ML IJ SOLN
50.0000 ug | INTRAMUSCULAR | Status: DC | PRN
  Administered 2015-06-06: 100 ug via INTRAVENOUS

## 2015-06-06 MED ORDER — LACTATED RINGERS IV SOLN
INTRAVENOUS | Status: DC
  Administered 2015-06-06 (×2): via INTRAVENOUS

## 2015-06-06 MED ORDER — OXYCODONE HCL 5 MG/5ML PO SOLN: 5.0000 mg | Freq: Once | ORAL | Status: AC | PRN

## 2015-06-06 MED ORDER — MIDAZOLAM HCL 5 MG/5ML IJ SOLN
INTRAMUSCULAR | Status: DC | PRN
  Administered 2015-06-06: 2 mg via INTRAVENOUS

## 2015-06-06 MED ORDER — PROPOFOL 10 MG/ML IV BOLUS
INTRAVENOUS | Status: AC
  Filled 2015-06-06: qty 20

## 2015-06-06 MED ORDER — MIDAZOLAM HCL 2 MG/2ML IJ SOLN
1.0000 mg | INTRAMUSCULAR | Status: DC | PRN
  Administered 2015-06-06: 2 mg via INTRAVENOUS

## 2015-06-06 MED ORDER — CEFAZOLIN SODIUM-DEXTROSE 2-3 GM-% IV SOLR
INTRAVENOUS | Status: AC
  Filled 2015-06-06: qty 50

## 2015-06-06 MED ORDER — BACLOFEN 10 MG PO TABS: 10.0000 mg | ORAL_TABLET | Freq: Three times a day (TID) | ORAL | Status: DC

## 2015-06-06 MED ORDER — PROPOFOL 10 MG/ML IV BOLUS
INTRAVENOUS | Status: DC | PRN
  Administered 2015-06-06: 200 mg via INTRAVENOUS
  Administered 2015-06-06: 30 mg via INTRAVENOUS

## 2015-06-06 MED ORDER — OXYCODONE HCL 5 MG PO TABS
5.0000 mg | ORAL_TABLET | Freq: Once | ORAL | Status: AC | PRN
  Administered 2015-06-06: 5 mg via ORAL

## 2015-06-06 MED ORDER — HYDROMORPHONE HCL 1 MG/ML IJ SOLN
INTRAMUSCULAR | Status: AC
  Filled 2015-06-06: qty 1

## 2015-06-06 SURGICAL SUPPLY — 62 items
BLADE CUTTER GATOR 3.5 (BLADE) ×3 IMPLANT
BLADE GREAT WHITE 4.2 (BLADE) IMPLANT
BLADE GREAT WHITE 4.2MM (BLADE)
BLADE SURG 15 STRL LF DISP TIS (BLADE) IMPLANT
BLADE SURG 15 STRL SS (BLADE)
BUR OVAL 6.0 (BURR) IMPLANT
CANNULA 5.75X71 LONG (CANNULA) ×3 IMPLANT
CANNULA TWIST IN 8.25X7CM (CANNULA) IMPLANT
CLOSURE STERI-STRIP 1/2X4 (GAUZE/BANDAGES/DRESSINGS) ×1
CLSR STERI-STRIP ANTIMIC 1/2X4 (GAUZE/BANDAGES/DRESSINGS) ×2 IMPLANT
DECANTER SPIKE VIAL GLASS SM (MISCELLANEOUS) IMPLANT
DRAPE INCISE IOBAN 66X45 STRL (DRAPES) ×3 IMPLANT
DRAPE SHOULDER BEACH CHAIR (DRAPES) ×3 IMPLANT
DRAPE U 20/CS (DRAPES) ×3 IMPLANT
DRAPE U-SHAPE 47X51 STRL (DRAPES) ×3 IMPLANT
DRSG PAD ABDOMINAL 8X10 ST (GAUZE/BANDAGES/DRESSINGS) ×3 IMPLANT
DURAPREP 26ML APPLICATOR (WOUND CARE) ×3 IMPLANT
ELECT REM PT RETURN 9FT ADLT (ELECTROSURGICAL)
ELECTRODE REM PT RTRN 9FT ADLT (ELECTROSURGICAL) IMPLANT
GAUZE SPONGE 4X4 12PLY STRL (GAUZE/BANDAGES/DRESSINGS) ×3 IMPLANT
GLOVE BIO SURGEON STRL SZ8 (GLOVE) ×3 IMPLANT
GLOVE BIOGEL PI IND STRL 7.0 (GLOVE) ×2 IMPLANT
GLOVE BIOGEL PI IND STRL 8 (GLOVE) ×2 IMPLANT
GLOVE BIOGEL PI INDICATOR 7.0 (GLOVE) ×4
GLOVE BIOGEL PI INDICATOR 8 (GLOVE) ×4
GLOVE ECLIPSE 6.5 STRL STRAW (GLOVE) ×3 IMPLANT
GLOVE ORTHO TXT STRL SZ7.5 (GLOVE) ×3 IMPLANT
GOWN STRL REUS W/ TWL LRG LVL3 (GOWN DISPOSABLE) ×1 IMPLANT
GOWN STRL REUS W/ TWL XL LVL3 (GOWN DISPOSABLE) ×2 IMPLANT
GOWN STRL REUS W/TWL LRG LVL3 (GOWN DISPOSABLE) ×3
GOWN STRL REUS W/TWL XL LVL3 (GOWN DISPOSABLE) ×6
IMMOBILIZER SHOULDER FOAM XLGE (SOFTGOODS) IMPLANT
KIT SHOULDER TRACTION (DRAPES) ×3 IMPLANT
LASSO 90 CVE QUICKPAS (DISPOSABLE) IMPLANT
MANIFOLD NEPTUNE II (INSTRUMENTS) ×3 IMPLANT
NEEDLE SCORPION MULTI FIRE (NEEDLE) IMPLANT
PACK ARTHROSCOPY DSU (CUSTOM PROCEDURE TRAY) ×3 IMPLANT
PACK BASIN DAY SURGERY FS (CUSTOM PROCEDURE TRAY) ×3 IMPLANT
SET ARTHROSCOPY TUBING (MISCELLANEOUS) ×3
SET ARTHROSCOPY TUBING LN (MISCELLANEOUS) ×1 IMPLANT
SHEET MEDIUM DRAPE 40X70 STRL (DRAPES) ×3 IMPLANT
SLEEVE SCD COMPRESS KNEE MED (MISCELLANEOUS) ×3 IMPLANT
SLING ARM FOAM STRAP LRG (SOFTGOODS) IMPLANT
SLING ARM IMMOBILIZER LRG (SOFTGOODS) IMPLANT
SLING ARM IMMOBILIZER MED (SOFTGOODS) IMPLANT
SLING ARM MED ADULT FOAM STRAP (SOFTGOODS) IMPLANT
SLING ARM XL FOAM STRAP (SOFTGOODS) ×3 IMPLANT
SUT FIBERWIRE #2 38 T-5 BLUE (SUTURE)
SUT MNCRL AB 4-0 PS2 18 (SUTURE) ×3 IMPLANT
SUT PDS AB 1 CT  36 (SUTURE)
SUT PDS AB 1 CT 36 (SUTURE) IMPLANT
SUT TIGER TAPE 7 IN WHITE (SUTURE) IMPLANT
SUT VIC AB 3-0 SH 27 (SUTURE)
SUT VIC AB 3-0 SH 27X BRD (SUTURE) IMPLANT
SUTURE FIBERWR #2 38 T-5 BLUE (SUTURE) IMPLANT
TAPE FIBER 2MM 7IN #2 BLUE (SUTURE) ×3 IMPLANT
TOWEL OR 17X24 6PK STRL BLUE (TOWEL DISPOSABLE) ×3 IMPLANT
TOWEL OR NON WOVEN STRL DISP B (DISPOSABLE) ×3 IMPLANT
TUBE CONNECTING 20'X1/4 (TUBING)
TUBE CONNECTING 20X1/4 (TUBING) IMPLANT
WAND STAR VAC 90 (SURGICAL WAND) ×3 IMPLANT
WATER STERILE IRR 1000ML POUR (IV SOLUTION) ×3 IMPLANT

## 2015-06-06 NOTE — Anesthesia Procedure Notes (Addendum)
Anesthesia Regional Block:  Interscalene brachial plexus block  Pre-Anesthetic Checklist: ,, timeout performed, Correct Patient, Correct Site, Correct Laterality, Correct Procedure, Correct Position, site marked, Risks and benefits discussed,  Surgical consent,  Pre-op evaluation,  At surgeon's request and post-op pain management  Laterality: Right and Upper  Prep: chloraprep       Needles:  Injection technique: Single-shot  Needle Type: Echogenic Needle     Needle Length: 5cm 5 cm Needle Gauge: 21 and 21 G    Additional Needles:  Procedures: ultrasound guided (picture in chart) Interscalene brachial plexus block Narrative:  Start time: 06/06/2015 10:09 AM End time: 06/06/2015 10:14 AM Injection made incrementally with aspirations every 5 mL.  Performed by: Personally  Anesthesiologist: CREWS, DAVID   Procedure Name: Intubation Date/Time: 06/06/2015 11:07 AM Performed by: Marrianne Mood Pre-anesthesia Checklist: Patient identified, Emergency Drugs available, Suction available, Patient being monitored and Timeout performed Patient Re-evaluated:Patient Re-evaluated prior to inductionOxygen Delivery Method: Circle System Utilized Preoxygenation: Pre-oxygenation with 100% oxygen Intubation Type: IV induction Ventilation: Mask ventilation without difficulty Laryngoscope Size: Miller and 3 Grade View: Grade III Tube type: Oral Tube size: 8.0 mm Number of attempts: 1 Airway Equipment and Method: Stylet and Oral airway Placement Confirmation: ETT inserted through vocal cords under direct vision,  positive ETCO2 and breath sounds checked- equal and bilateral Secured at: 23 cm Tube secured with: Tape Dental Injury: Teeth and Oropharynx as per pre-operative assessment  Difficulty Due To: Difficult Airway- due to reduced neck mobility      Right ISB image

## 2015-06-06 NOTE — H&P (Signed)
PREOPERATIVE H&P  Chief Complaint: Right shoulder pain  HPI: Terry Benson is a 54 y.o. male who presents for preoperative history and physical with a diagnosis of right shoulder labral tear with multiple loose bodies . Symptoms are rated as moderate to severe, and have been worsening.  This is significantly impairing activities of daily living.  He has elected for surgical management. He has had difficulty sleeping and difficulty working out, and sometimes when he gets his arm in certain positions at work he feels like the arm has to drop because of the pain. This began after a fall where he fell directly onto the right shoulder while getting up in the middle of the night to answer page.  Past Medical History  Diagnosis Date  . GERD (gastroesophageal reflux disease)   . Internal hemorrhoid     by colonoscopy 2011  . Allergic rhinitis    Past Surgical History  Procedure Laterality Date  . Eye surgery      x 2  . Hip surgery     Social History   Social History  . Marital Status: Married    Spouse Name: N/A  . Number of Children: N/A  . Years of Education: N/A   Social History Main Topics  . Smoking status: Never Smoker   . Smokeless tobacco: Current User  . Alcohol Use: Yes     Comment: occas  . Drug Use: No  . Sexual Activity:    Partners: Female   Other Topics Concern  . 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 Rides motorcycle for leisure - aware of risks.   Family History  Problem Relation Age of Onset  . Cancer Paternal Aunt     colon  . Cancer Maternal Grandfather     colon   No Known Allergies Prior to Admission medications   Not on File     Positive ROS: All other systems have been reviewed and were otherwise negative with the exception of those mentioned in the HPI and as above.  Physical Exam: General: Alert, no acute distress Cardiovascular: No pedal edema Respiratory: No cyanosis, no use of accessory  musculature GI: No organomegaly, abdomen is soft and non-tender Skin: No lesions in the area of chief complaint Neurologic: Sensation intact distally Psychiatric: Patient is competent for consent with normal mood and affect Lymphatic: No axillary or cervical lymphadenopathy  MUSCULOSKELETAL: Right shoulder active motion is 0-170, cuff strength is intact, external rotation is to 40, no pain over the before meals joint.  Assessment: Right shoulder labrum tear and multiple loose bodies anterior to the subscapularis   Plan: Plan for Procedure(s): RIGHT SHOULDER ARTHROSCOPY WITH DEBRIDEMENT ACROMIOPLASTY LOOSE BODY EXCISION   The risks benefits and alternatives were discussed with the patient including but not limited to the risks of nonoperative treatment, versus surgical intervention including infection, bleeding, nerve injury,  blood clots, cardiopulmonary complications, morbidity, mortality, among others, and they were willing to proceed. We have also discussed the potential for incomplete relief of symptoms, persistent stiffness, as well as the potential for future surgery.  Johnny Bridge, MD Cell [phone]   06/06/2015 10:48 AM

## 2015-06-06 NOTE — Progress Notes (Signed)
Assisted Dr. Crews with right, ultrasound guided, interscalene  block. Side rails up, monitors on throughout procedure. See vital signs in flow sheet. Tolerated Procedure well. 

## 2015-06-06 NOTE — Op Note (Signed)
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  ANESTHESIA:   General  PREOPERATIVE INDICATIONS:  [name] is a  54 y.o. male who fell a couple of months ago, and had persistent right shoulder pain, with catching, and loss of motion who failed conservative measures and elected for surgical management.    The risks benefits and alternatives were discussed with the patient preoperatively including but not limited to the risks of infection, bleeding, nerve injury, cardiopulmonary complications, the need for revision surgery, among others, and the patient was willing to proceed.  OPERATIVE IMPLANTS: None  OPERATIVE FINDINGS: The shoulder had significant stiffness during examination under anesthesia and lacked at least 20 of forward flexion, and manipulation under anesthesia yielded lysis of adhesions and full motion. The articular cartilage was normal, the biceps tendon and subscapularis was normal, the rotator interval had injection consistent with adhesive capsulitis. The inferior labrum was intact, the posterior labrum was also intact, the undersurface of the supraspinatus had some moderate fraying, and was debrided about 10%.  I did not find any chondral lesions, or loose bodies either within the joint, or anterior to the subscapularis.  The CA ligament was pristine, although there was some subacromial thickening of  the bursa, the rotator cuff was intact from above.  OPERATIVE PROCEDURE: The patient was brought to the operating room and placed in the supine position. Gen. anesthesia was administered. IV antibiotics were given. He was turned into the semilateral decubitus position and all bony prominences padded. Manipulation under anesthesia yielded significant lysis of adhesions.  The right upper extremity was prepped and draped in usual sterile fashion. Time out was performed and the right arm was suspended in 15 pounds of traction. Diagnostic arthroscopy was carried out with the above-named findings. The arthroscopic shaver was used to debride the labrum anteriorly as well as the undersurface of the supraspinatus and I used a ArthroCare wand to resect the rotator interval. I went through the interval and then looked down anterior to the subscapularis looking for any loose bodies, but did not really find any.  I went to the subacromial space and performed a complete bursectomy. The CA ligament was completely intact, with no signs of mechanical abrasion, so I did not perform an acromioplasty. There was some thickened bursa which I resected with the shaver. I viewed from the lateral portal confirming integrity of the rotator cuff, and also went over anteriorly again to see if I could see any signs of loose bodies in the anterior recess, but did not find any.  The instruments removed and the portals closed with Monocryl followed by Steri-Strips and sterile gauze. He was awakened and returned to PACU in stable and satisfactory condition. There were no complications and he tolerated the procedure well

## 2015-06-06 NOTE — Anesthesia Postprocedure Evaluation (Signed)
Anesthesia Post Note  Patient: Terry Benson  Procedure(s) Performed: Procedure(s) (LRB): RIGHT SHOULDER ARTHROSCOPY WITH EXTENSIVE LABRAL DEBRIDEMENT AND MANIPULATION (Right)  Patient location during evaluation: PACU Anesthesia Type: General Level of consciousness: awake and alert Pain management: pain level controlled Vital Signs Assessment: post-procedure vital signs reviewed and stable Respiratory status: spontaneous breathing, nonlabored ventilation and respiratory function stable Cardiovascular status: blood pressure returned to baseline and stable Postop Assessment: no signs of nausea or vomiting Anesthetic complications: no    Last Vitals:  Filed Vitals:   06/06/15 1300 06/06/15 1315  BP: 125/86 132/93  Pulse: 85 78  Temp:    Resp: 19 44    Last Pain:  Filed Vitals:   06/06/15 1318  PainSc: 0-No pain                 Rolande Moe A

## 2015-06-06 NOTE — Anesthesia Preprocedure Evaluation (Signed)
Anesthesia Evaluation  Patient identified by MRN, date of birth, ID band Patient awake    Reviewed: Allergy & Precautions, NPO status , Patient's Chart, lab work & pertinent test results  Airway Mallampati: I  TM Distance: >3 FB Neck ROM: Full    Dental  (+) Teeth Intact, Dental Advisory Given   Pulmonary    breath sounds clear to auscultation       Cardiovascular  Rhythm:Regular Rate:Normal     Neuro/Psych    GI/Hepatic GERD  Medicated and Controlled,  Endo/Other    Renal/GU      Musculoskeletal   Abdominal   Peds  Hematology   Anesthesia Other Findings   Reproductive/Obstetrics                             Anesthesia Physical Anesthesia Plan  ASA: II  Anesthesia Plan: General   Post-op Pain Management: MAC Combined w/ Regional for Post-op pain   Induction: Intravenous  Airway Management Planned: Oral ETT  Additional Equipment:   Intra-op Plan:   Post-operative Plan: Extubation in OR  Informed Consent: I have reviewed the patients History and Physical, chart, labs and discussed the procedure including the risks, benefits and alternatives for the proposed anesthesia with the patient or authorized representative who has indicated his/her understanding and acceptance.   Dental advisory given  Plan Discussed with: CRNA, Anesthesiologist and Surgeon  Anesthesia Plan Comments:         Anesthesia Quick Evaluation

## 2015-06-06 NOTE — Discharge Instructions (Signed)
Diet: As you were doing prior to hospitalization   Shower:  May shower but keep the wounds dry, use an occlusive plastic wrap, NO SOAKING IN TUB.  If the bandage gets wet, change with a clean dry gauze.  If you have a splint on, leave the splint in place and keep the splint dry with a plastic bag.  Dressing:  You may change your dressing 3-5 days after surgery, unless you have a splint.  If you have a splint, then just leave the splint in place and we will change your bandages during your first follow-up appointment.    If you had hand or foot surgery, we will plan to remove your stitches in about 2 weeks in the office.  For all other surgeries, there are sticky tapes (steri-strips) on your wounds and all the stitches are absorbable.  Leave the steri-strips in place when changing your dressings, they will peel off with time, usually 2-3 weeks.  Activity:  Increase activity slowly as tolerated, but follow the weight bearing instructions below.  The rules on driving is that you can not be taking narcotics while you drive, and you must feel in control of the vehicle.    Weight Bearing:   Sling as needed.  OK to remove when tolerated..    To prevent constipation: you may use a stool softener such as -  Colace (over the counter) 100 mg by mouth twice a day  Drink plenty of fluids (prune juice may be helpful) and high fiber foods Miralax (over the counter) for constipation as needed.    Itching:  If you experience itching with your medications, try taking only a single pain pill, or even half a pain pill at a time.  You may take up to 10 pain pills per day, and you can also use benadryl over the counter for itching or also to help with sleep.   Precautions:  If you experience chest pain or shortness of breath - call 911 immediately for transfer to the hospital emergency department!!  If you develop a fever greater that 101 F, purulent drainage from wound, increased redness or drainage from wound, or  calf pain -- Call the office at [phone]                                                Follow- Up Appointment:  Please call for an appointment to be seen in 2 weeks [place] - [phone]     Regional Anesthesia Blocks  1. Numbness or the inability to move the "blocked" extremity may last from 3-48 hours after placement. The length of time depends on the medication injected and your individual response to the medication. If the numbness is not going away after 48 hours, call your surgeon.  2. The extremity that is blocked will need to be protected until the numbness is gone and the  Strength has returned. Because you cannot feel it, you will need to take extra care to avoid injury. Because it may be weak, you may have difficulty moving it or using it. You may not know what position it is in without looking at it while the block is in effect.  3. For blocks in the legs and feet, returning to weight bearing and walking needs to be done carefully. You will need to wait until the numbness is entirely gone  and the strength has returned. You should be able to move your leg and foot normally before you try and bear weight or walk. You will need someone to be with you when you first try to ensure you do not fall and possibly risk injury.  4. Bruising and tenderness at the needle site are common side effects and will resolve in a few days.  5. Persistent numbness or new problems with movement should be communicated to the surgeon or the [facility] [phone] [place] [phone]).    Post Anesthesia Home Care Instructions  Activity: Get plenty of rest for the remainder of the day. A responsible adult should stay with you for 24 hours following the procedure.  For the next 24 hours, DO NOT: -Drive a car -Paediatric nurse -Drink alcoholic beverages -Take any medication unless instructed by your physician -Make any legal decisions or sign important  papers.  Meals: Start with liquid foods such as gelatin or soup. Progress to regular foods as tolerated. Avoid greasy, spicy, heavy foods. If nausea and/or vomiting occur, drink only clear liquids until the nausea and/or vomiting subsides. Call your physician if vomiting continues.  Special Instructions/Symptoms: Your throat may feel dry or sore from the anesthesia or the breathing tube placed in your throat during surgery. If this causes discomfort, gargle with warm salt water. The discomfort should disappear within 24 hours.  If you had a scopolamine patch placed behind your ear for the management of post- operative nausea and/or vomiting:  1. The medication in the patch is effective for 72 hours, after which it should be removed.  Wrap patch in a tissue and discard in the trash. Wash hands thoroughly with soap and water. 2. You may remove the patch earlier than 72 hours if you experience unpleasant side effects which may include dry mouth, dizziness or visual disturbances. 3. Avoid touching the patch. Wash your hands with soap and water after contact with the patch.

## 2015-06-06 NOTE — Transfer of Care (Signed)
Immediate Anesthesia Transfer of Care Note  Patient: Terry Benson  Procedure(s) Performed: Procedure(s): RIGHT SHOULDER ARTHROSCOPY WITH EXTENSIVE LABRAL DEBRIDEMENT AND MANIPULATION (Right)  Patient Location: PACU  Anesthesia Type:GA combined with regional for post-op pain  Level of Consciousness: sedated  Airway & Oxygen Therapy: Patient Spontanous Breathing and Patient connected to face mask oxygen  Post-op Assessment: Report given to RN and Post -op Vital signs reviewed and stable  Post vital signs: Reviewed and stable  Last Vitals:  Filed Vitals:   06/06/15 1050 06/06/15 1055  BP:    Pulse: 74 72  Temp:    Resp: 16 19    Complications: No apparent anesthesia complications

## 2015-06-10 ENCOUNTER — Encounter (HOSPITAL_BASED_OUTPATIENT_CLINIC_OR_DEPARTMENT_OTHER): Payer: Self-pay | Admitting: Orthopedic Surgery

## 2015-06-18 DIAGNOSIS — M7501 Adhesive capsulitis of right shoulder: Secondary | ICD-10-CM | POA: Diagnosis not present

## 2016-01-07 ENCOUNTER — Other Ambulatory Visit: Payer: Self-pay | Admitting: Nurse Practitioner

## 2016-01-07 DIAGNOSIS — R42 Dizziness and giddiness: Secondary | ICD-10-CM

## 2016-01-26 DIAGNOSIS — H5202 Hypermetropia, left eye: Secondary | ICD-10-CM | POA: Diagnosis not present

## 2016-01-26 DIAGNOSIS — H5211 Myopia, right eye: Secondary | ICD-10-CM | POA: Diagnosis not present

## 2016-01-26 DIAGNOSIS — H52223 Regular astigmatism, bilateral: Secondary | ICD-10-CM | POA: Diagnosis not present

## 2016-01-28 ENCOUNTER — Ambulatory Visit: Payer: 59 | Attending: Cardiovascular Disease | Admitting: Physical Therapy

## 2016-01-28 ENCOUNTER — Encounter: Payer: Self-pay | Admitting: Physical Therapy

## 2016-01-28 DIAGNOSIS — R42 Dizziness and giddiness: Secondary | ICD-10-CM | POA: Insufficient documentation

## 2016-01-28 DIAGNOSIS — H8112 Benign paroxysmal vertigo, left ear: Secondary | ICD-10-CM | POA: Insufficient documentation

## 2016-01-28 NOTE — Patient Instructions (Addendum)
Dr. Johnsie Cancel,  Try sleeping on your left side at night.  Tip Card 1.The goal of habituation training is to assist in decreasing symptoms of vertigo, dizziness, or nausea provoked by specific head and body motions. 2.These exercises may initially increase symptoms; however, be persistent and work through symptoms. With repetition and time, the exercises will assist in reducing or eliminating symptoms. 3.Exercises should be stopped and discussed with the therapist if you experience any of the following: - Sudden change or fluctuation in hearing - New onset of ringing in the ears, or increase in current intensity - Any fluid discharge from the ear - Severe pain in neck or back - Extreme nausea  Copyright  [employer]. All rights reserved.  Rolling   With pillow under head, start on back. Roll quickly to your right side.  Hold until dizziness stops, plus 20 seconds and then roll quickly to the left side without stopping on your back.  Hold until dizziness stops, plus 20 seconds.  Repeat sequence 5 times per session. Do 2 sessions per day.

## 2016-01-28 NOTE — Therapy (Signed)
[facility] [address] [place], [place], [address] Phone: [phone]   Fax:  [phone]  Physical Therapy Evaluation  Patient Details  Name: Terry Benson MRN: [id number] Date of Birth: 1960-10-11 Referring Provider: Sherren Mocha, MD  Encounter Date: 01/28/2016      PT End of Session - 01/28/16 1701    Visit Number 1   Number of Visits 4  eval + 3 visits   Date for PT Re-Evaluation 02/27/16   Authorization Type UMR   PT Start Time 1536   PT Stop Time [id number]   PT Time Calculation (min) 59 min   Activity Tolerance Patient tolerated treatment well   Behavior During Therapy [facility] for tasks assessed/performed      Past Medical History:  Diagnosis Date  . Adhesive capsulitis of right shoulder 06/06/2015  . Allergic rhinitis   . GERD (gastroesophageal reflux disease)   . Internal hemorrhoid    by colonoscopy 2011    Past Surgical History:  Procedure Laterality Date  . EYE SURGERY     x 2  . HIP SURGERY    . SHOULDER ARTHROSCOPY Right 06/06/2015   Procedure: RIGHT SHOULDER ARTHROSCOPY WITH EXTENSIVE LABRAL DEBRIDEMENT AND MANIPULATION;  Surgeon: Marchia Bond, MD;  Location: [facility];  Service: Orthopedics;  Laterality: Right;    There were no vitals filed for this visit.       Subjective Assessment - 01/28/16 1538    Subjective "Five or 6 times a year, I get this. I lay like this (on my left side) and then get out of bed like this (rolls to R side into seated EOM). I have the acute episodes, then after that I have low-level dizziness for days." Pt reports the only new thing is the low-level dizziness he experiences for days. Pt saw an ENT in the past for eustacian tube dysfunction.    Patient Stated Goals "I'd like to know if there's a             [facility] PT Assessment - 01/28/16 0001      Assessment   Medical Diagnosis Vertigo   Referring Provider Sherren Mocha, MD   Onset  Date/Surgical Date 01/07/16  date of MD referral     Precautions   Precautions None     Restrictions   Weight Bearing Restrictions No     Balance Screen   Has the patient fallen in the past 6 months No   Has the patient had a decrease in activity level because of a fear of falling?  No   Is the patient reluctant to leave their home because of a fear of falling?  No     Home Ecologist residence   Living Arrangements Spouse/significant other     Prior Function   Level of Independence Independent   Vocation Full time employment   Pensions consultant   Leisure riding motorcycle     Cognition   Overall Cognitive Status Within Functional Limits for tasks assessed            Vestibular Assessment - 01/28/16 0001      Symptom Behavior   Type of Dizziness Spinning  and nausea   Frequency of Dizziness once every 2-4 weeks   Duration of Dizziness acute vertigo: < 1 minute; low-grade nausea and disequilibrium: days   Aggravating Factors Supine to sit  L sidelying > seated EOB   Relieving Factors Medication  Zofran  and meclizine     Occulomotor Exam   Occulomotor Alignment Abnormal  L strabismus (chronic)   Spontaneous Absent   Gaze-induced Right beating nystagmus with R gaze  asymptomatic   Smooth Pursuits Intact   Saccades Intact   Comment Head Thrust Test (-) and asymptomatic bilaterally     Vestibulo-Occular Reflex   VOR 1 Head Only (x 1 viewing) Appears intact; asymptomatic     Positional Testing   Dix-Hallpike Dix-Hallpike Right;Dix-Hallpike Left   Sidelying Test Sidelying Left;Sidelying Right   Horizontal Canal Testing Horizontal Canal Right;Horizontal Canal Left;Horizontal Canal Right Intensity;Horizontal Canal Left Intensity     Dix-Hallpike Right   Dix-Hallpike Right Duration NA   Dix-Hallpike Right Symptoms No nystagmus     Dix-Hallpike Left   Dix-Hallpike Left Duration NA   Dix-Hallpike Left Symptoms  No nystagmus     Sidelying Right   Sidelying Right Duration NA   Sidelying Right Symptoms No nystagmus     Sidelying Left   Sidelying Left Duration trace    Sidelying Left Symptoms Upbeat, left rotatory nystagmus     Horizontal Canal Right   Horizontal Canal Right Duration 12-15 seconds, only visible when gaze in direction of nystagmus  reproduces concordant dizziness   Horizontal Canal Right Symptoms Ageotrophic;Nystagmus     Horizontal Canal Left   Horizontal Canal Left Duration < 10 seconds, only visible when gaze in direction of nystagmus   Horizontal Canal Left Symptoms Ageotrophic;Nystagmus     Horizontal Canal Right Intensity   Horizontal Canal Right Intensity Mild     Horizontal Canal Left Intensity   Horizontal Canal Left Intensity --  N/A                Vestibular Treatment/Exercise - 01/28/16 0001      Vestibular Treatment/Exercise   Vestibular Treatment Provided Canalith Repositioning;Habituation   Canalith Repositioning Comment   Habituation Exercises Horizontal Roll     OTHER   Comment L Gufoni x2 trials. Reassessment of horizontal canals reveals ongoing ageotropic nystagmus (<5 seconds) on R side only; minimal symptoms. Therefore, performed R Cupulolith Repositioning Maneuver as described by Liborio Nixon al (2012) using vibrator at L mastoid process during positions 1 and 4. Reassessment of B horizontal canals reveals no nystagmus, asymptomatic (suspect nystagmus fatigued).      Horizontal Roll   Number of Reps  2  fast rolling   Symptom Description  Minimal symptoms on R; no symptoms on L.               PT Education - 01/28/16 1645    Education provided Yes   Education Details PT eval findings, goals, and POC. Initiated HEP for fast rolling for habituation. Educated pt on prolonged positioning.   Person(s) Educated Patient   Methods Explanation;Demonstration;Handout   Comprehension Verbalized understanding;Returned demonstration           PT Short Term Goals - 01/28/16 1714      PT SHORT TERM GOAL #1   Title STG's = LTG's           PT Long Term Goals - 01/28/16 1716      PT LONG TERM GOAL #1   Title Pt will independently perform habituation HEP to maximize functional gains made in PT.  (Target: 02/25/16)     PT LONG TERM GOAL #2   Title Positional vertigo testing will be negative to indicate resolved BPPV.  (02/25/16)     PT LONG TERM GOAL #3   Title Pt will decrease  DHI score from 20 to < / = 2 to indicate significant decrease in pt-perceived disability due to dizziness.  (02/25/16)     PT LONG TERM GOAL #4   Title Pt will verbalize understanding of management of BPPV due to history of episodic vertigo.  (02/25/16)               Plan - 01/28/16 1703    Clinical Impression Statement Pt is a 55 y/o M referred to vestibular PT to address vertigo. PMH significant for labyrinthitis. PT evaluation reveals the following: R gaze-evoked nystagmus; horizontal canal testing with ageotrophic nystagmus (only visible in room light when gaze in direction of nystagmus), severity of symptoms and amplitude of nystamus on R > L. Based on findings, unable to rule out L horizontal canal cupulolithiasis. Therefore, treated with L Gufoni maneuver x2 trials. Reassessment reveals ageotropic nystagmus grossly unchanged. Therefore, performed L Cupulolith Repositioning Maneuver as desribed by Liborio Nixon al (2012). Reassessment reveals no nystagmus, no symptoms; suspect this is due to habituation during treatment. Educated pt on prolonged positioning and fast rolling for HC habituation. Pt will continue to benefit from vestibular PT for 3 total sessions over 4 weeks to address said impairments.    Rehab Potential Excellent   Clinical Impairments Affecting Rehab Potential excellent health literacy and compliance, as pt is a physician   PT Frequency Other (comment)  3 total visits   PT Duration 4 weeks   PT Treatment/Interventions ADLs/Self Care  Home Management;Canalith Repostioning;Neuromuscular re-education;Balance training;Patient/family education;Therapeutic exercise;Vestibular   PT Next Visit Plan Reassess for [facility] BPPV and treat prn. Pt may also have trace L PC BPPV. Educate on self-treatment, as appropriate.   Consulted and Agree with Plan of Care Patient      Patient will benefit from skilled therapeutic intervention in order to improve the following deficits and impairments:  Dizziness  Visit Diagnosis: BPPV (benign paroxysmal positional vertigo), left - Plan: PT plan of care cert/re-cert  Dizziness and giddiness - Plan: PT plan of care cert/re-cert     Problem List Patient Active Problem List   Diagnosis Date Noted  . Adhesive capsulitis of right shoulder 06/06/2015  . Sinusitis, chronic 08/26/2013  . Labyrinthitis 08/26/2013  . Dysfunction of both Eustachian tubes 08/26/2013  . Tobacco dipper 08/26/2013  . Allergic rhinitis 08/26/2013  . GERD 10/17/2009  . RECTAL BLEEDING 10/17/2009  . REFLUX ESOPHAGITIS, HX OF 10/17/2009    Billie Ruddy, PT, DPT [facility] [address] [place], [place], [address] Phone: [phone]   Fax:  [phone] 01/28/16, 5:23 PM  Name: Terry Benson MRN: [id number] Date of Birth: 1960-10-20

## 2016-02-06 ENCOUNTER — Encounter: Payer: Self-pay | Admitting: Physical Therapy

## 2016-02-25 ENCOUNTER — Ambulatory Visit: Payer: 59 | Attending: Cardiovascular Disease | Admitting: Rehabilitative and Restorative Service Providers"

## 2016-02-25 ENCOUNTER — Encounter: Payer: Self-pay | Admitting: Rehabilitative and Restorative Service Providers"

## 2016-02-25 DIAGNOSIS — R42 Dizziness and giddiness: Secondary | ICD-10-CM | POA: Diagnosis not present

## 2016-02-25 NOTE — Therapy (Signed)
[facility] [address] [place], [place], [address] Phone: [phone]   Fax:  [phone]  Physical Therapy Treatment  Patient Details  Name: Terry Benson MRN: [id number] Date of Birth: 08/25/1960 Referring Provider: Michael Cooper, MD  Encounter Date: 02/25/2016      PT End of Session - 02/25/16 1447    Visit Number 2   Number of Visits 4  eval + 3 visits   Date for PT Re-Evaluation 02/27/16   Authorization Type UMR   PT Start Time 1325   PT Stop Time 1405   PT Time Calculation (min) 40 min   Activity Tolerance Patient tolerated treatment well   Behavior During Therapy [facility] for tasks assessed/performed      Past Medical History:  Diagnosis Date  . Adhesive capsulitis of right shoulder 06/06/2015  . Allergic rhinitis   . GERD (gastroesophageal reflux disease)   . Internal hemorrhoid    by colonoscopy 2011    Past Surgical History:  Procedure Laterality Date  . EYE SURGERY     x 2  . HIP SURGERY    . SHOULDER ARTHROSCOPY Right 06/06/2015   Procedure: RIGHT SHOULDER ARTHROSCOPY WITH EXTENSIVE LABRAL DEBRIDEMENT AND MANIPULATION;  Surgeon: Joshua Landau, MD;  Location: [facility];  Service: Orthopedics;  Laterality: Right;    There were no vitals filed for this visit.      Subjective Assessment - 02/25/16 1330    Subjective The patient reports he has constant sensations of dizziness 5 times/year.  He doesn't feel that he knows which canal is involved and wanted to f/u to check.  The patient has been doing quick rolling without symptoms at home.                  Vestibular Assessment - 02/25/16 1400      Occulomotor Exam   Comment No evidence today of nystagmus with R gaze; patient has monocular vision congenitally and switches between R and L eye vision with head motion.      Vestibulo-Occular Reflex   Comment VOR at self regulated pace= patient has "uncomfortable" feeling  as he switches from R eye vision to L eye vision.     Positional Testing   Dix-Hallpike Dix-Hallpike Right;Dix-Hallpike Left   Sidelying Test Sidelying Right;Sidelying Left   Horizontal Canal Testing Horizontal Canal Right;Horizontal Canal Left     Dix-Hallpike Right   Dix-Hallpike Right Duration None   Dix-Hallpike Right Symptoms No nystagmus     Dix-Hallpike Left   Dix-Hallpike Left Duration none   Dix-Hallpike Left Symptoms No nystagmus     Sidelying Right   Sidelying Right Duration none   Sidelying Right Symptoms No nystagmus     Sidelying Left   Sidelying Left Duration none   Sidelying Left Symptoms No nystagmus     Horizontal Canal Right   Horizontal Canal Right Duration none   Horizontal Canal Right Symptoms Normal     Horizontal Canal Left   Horizontal Canal Left Duration none   Horizontal Canal Left Symptoms Normal                 [facility] Adult PT Treatment/Exercise - 02/25/16 1400      Self-Care   Self-Care Other Self-Care Comments   Other Self-Care Comments  PT and patient discussed no current evidence of BPPV.  Discussed/educated on self mgmt using VOR x 1 gaze adaptation exercises, habituation for posterior and horizontal BPPV mgmt.           Vestibular Treatment/Exercise - 02/25/16 1400      Vestibular Treatment/Exercise   Vestibular Treatment Provided Gaze   Habituation Exercises Brandt Daroff;Horizontal Roll   Gaze Exercises X1 Viewing Horizontal;X1 Viewing Vertical     Brandt Daroff   Symptom Description  demonstrated with handout as way to self manage posterior canalithiasis BPPV     Horizontal Roll   Symptom Description  provided as handout as self mgmt of horiozntal canal BPPV     X1 Viewing Horizontal   Foot Position standing    Comments 3 feet from target with cues on head motion and gaze stability     X1 Viewing Vertical   Foot Position standing   Comments not as difficult as horizontal- recommended horizontal to be used as  "resetting" for visual/vestibular control               PT Education - 02/25/16 1447    Education provided Yes   Education Details HEP as a plan for self management of future episodes.   Person(s) Educated Patient   Methods Explanation;Demonstration;Handout   Comprehension Verbalized understanding;Returned demonstration          PT Short Term Goals - 01/28/16 1714      PT SHORT TERM GOAL #1   Title STG's = LTG's           PT Long Term Goals - 02/25/16 1850      PT LONG TERM GOAL #1   Title Pt will independently perform habituation HEP to maximize functional gains made in PT.  (Target: 02/25/16)   Baseline Met on 02/25/2016   Status Achieved     PT LONG TERM GOAL #2   Title Positional vertigo testing will be negative to indicate resolved BPPV.  (02/25/16)   Baseline Met on 02/25/2016   Status Achieved     PT LONG TERM GOAL #3   Title Pt will decrease DHI score from 20 to < / = 2 to indicate significant decrease in pt-perceived disability due to dizziness.  (02/25/16)   Baseline Patient not retested as he had to leave clinic due to work schedule today.    Status Deferred     PT LONG TERM GOAL #4   Title Pt will verbalize understanding of management of BPPV due to history of episodic vertigo.  (02/25/16)   Baseline Met on 02/25/2016   Status Achieved               Plan - 02/25/16 1851    Clinical Impression Statement The patient met 3 LTGs.  His dizziness has improved.  He is seeking ways to manage future episodes of vertigo and PT provided education on self mgmt.  No further therapy needed.    PT Treatment/Interventions ADLs/Self Care Home Management;Canalith Repostioning;Neuromuscular re-education;Balance training;Patient/family education;Therapeutic exercise;Vestibular   PT Next Visit Plan Discharge today with self mgmt program.   Consulted and Agree with Plan of Care Patient      Patient will benefit from skilled therapeutic intervention in order to  improve the following deficits and impairments:  Dizziness  Visit Diagnosis: Dizziness and giddiness     Problem List Patient Active Problem List   Diagnosis Date Noted  . Adhesive capsulitis of right shoulder 06/06/2015  . Sinusitis, chronic 08/26/2013  . Labyrinthitis 08/26/2013  . Dysfunction of both Eustachian tubes 08/26/2013  . Tobacco dipper 08/26/2013  . Allergic rhinitis 08/26/2013  . GERD 10/17/2009  . RECTAL BLEEDING 10/17/2009  . REFLUX ESOPHAGITIS, HX OF 10/17/2009      WEAVER,CHRISTINA, PT 02/25/2016, 6:52 PM  [facility] [address] [place], [place], [address] Phone: [phone]   Fax:  [phone]  Name: Terry Benson MRN: [id number] Date of Birth: 06/25/1960    

## 2016-02-25 NOTE — Therapy (Signed)
[facility] [address] [place], [place], [address] Phone: [phone]   Fax:  [phone]  Patient Details  Name: Terry Benson MRN: [id number] Date of Birth: [birth year] Referring Provider:  No ref. provider found  Encounter Date: 02/25/2016  PHYSICAL THERAPY DISCHARGE SUMMARY  Visits from Start of Care: 2  Current functional level related to goals / functional outcomes:     PT Long Term Goals - 02/25/16 1850      PT LONG TERM GOAL #1   Title Pt will independently perform habituation HEP to maximize functional gains made in PT.  (Target: 02/25/16)   Baseline Met on 02/25/2016   Status Achieved     PT LONG TERM GOAL #2   Title Positional vertigo testing will be negative to indicate resolved BPPV.  (02/25/16)   Baseline Met on 02/25/2016   Status Achieved     PT LONG TERM GOAL #3   Title Pt will decrease DHI score from 20 to < / = 2 to indicate significant decrease in pt-perceived disability due to dizziness.  (02/25/16)   Baseline Patient not retested as he had to leave clinic due to work schedule today.    Status Deferred     PT LONG TERM GOAL #4   Title Pt will verbalize understanding of management of BPPV due to history of episodic vertigo.  (02/25/16)   Baseline Met on 02/25/2016   Status Achieved        Remaining deficits: H/o episodic vertigo   Education / Equipment: HEP, self mgmt of vertigo.  Plan: Patient agrees to discharge.  Patient goals were partially met. Patient is being discharged due to meeting the stated rehab goals.  ?????        Thank you for the referral of this patient. Rudell Cobb, MPT  Javion Holmer 02/25/2016, 6:57 PM  [facility] [address] [place], [place], [address] Phone: [phone]   Fax:  [phone]

## 2016-02-25 NOTE — Patient Instructions (Signed)
DO THIS EXERCISE AFTER ANY ACUTE EPISODE OF VERTIGO OR IF YOU HAVE ANY LOW LEVEL DIZZINESS:  Gaze Stabilization: Tip Card 1.Target must remain in focus, not blurry, and appear stationary while head is in motion. 2.Perform exercises with small head movements (45 to either side of midline). 3.Increase speed of head motion so long as target is in focus. 4.If you wear eyeglasses, be sure you can see target through lens (therapist will give specific instructions for bifocal / progressive lenses). 5.These exercises may provoke dizziness or nausea. Work through these symptoms. If too dizzy, slow head movement slightly. Rest between each exercise. 6.Exercises demand concentration; avoid distractions. 7.For safety, perform standing exercises close to a counter, wall, corner, or next to someone.  Copyright  VHI. All rights reserved.  Gaze Stabilization: Standing Feet Apart   Feet shoulder width apart, keeping eyes on target on wall 3 feet away, tilt head down slightly and move head side to side for 30 seconds. Repeat while moving head up and down for 30 seconds. Do 2 sessions per day.   Copyright  VHI. All rights reserved.   ONLY DO WHEN ACUTE DIZZINESS IS PRESENT.   General Guidelines: 1) Only do the exercise if it provokes mild to moderate dizziness. 2) May take up to 2 weeks to resolve completely. 3) Do exercise until you have 2 days symptom free.  Tip Card 1.The goal of habituation training is to assist in decreasing symptoms of vertigo, dizziness, or nausea provoked by specific head and body motions. 2.These exercises may initially increase symptoms; however, be persistent and work through symptoms. With repetition and time, the exercises will assist in reducing or eliminating symptoms. 3.Exercises should be stopped and discussed with the therapist if you experience any of the following: - Sudden change or fluctuation in hearing - New onset of ringing in the ears, or increase in current  intensity - Any fluid discharge from the ear - Severe pain in neck or back - Extreme nausea  Copyright  VHI. All rights reserved.  Rolling   With pillow under head, start on back. Roll to your right side.  Hold until dizziness stops, plus 20 seconds and then roll to the left side.  Hold until dizziness stops, plus 20 seconds.  Repeat sequence 5 times per session. Do 2 sessions per day.  Copyright  VHI. All rights reserved.  Sit to Side-Lying   Sit on edge of bed. Lie down onto the right side and hold until dizziness stops, plus 20 seconds.  Return to sitting and wait until dizziness stops, plus 20 seconds.  Repeat to the left side. Repeat sequence 5 times per session. Do 2 sessions per day.  Copyright  VHI. All rights reserved.    SELF TREATMENT WITH EPLEY'S FOR POSTERIOR CANAL BPPV: General Guidelines: 1) You would treat the side that provokes the most symtpoms 2) Hold each position until dizziness stops + 20-30 seconds  [email or web address]

## 2016-04-05 MED FILL — VENTOLIN HFA 90 MCG INHALER: 108 (90 BAS | 50 days supply | Qty: 18 | Fill #0

## 2016-04-05 MED FILL — predniSONE 10 MG TABS: 10 | 6 days supply | Qty: 21 | Fill #0

## 2016-05-24 MED FILL — AMOX-CLAV 875-125 MG TABLET: 875-125 | 10 days supply | Qty: 20 | Fill #0

## 2016-05-31 ENCOUNTER — Ambulatory Visit (HOSPITAL_COMMUNITY)
Admission: RE | Admit: 2016-05-31 | Discharge: 2016-05-31 | Disposition: A | Discharge status: Home / Self Care | Payer: 59 | Source: Ambulatory Visit | Attending: Orthopaedic Surgery | Admitting: Orthopaedic Surgery

## 2016-05-31 ENCOUNTER — Other Ambulatory Visit (INDEPENDENT_AMBULATORY_CARE_PROVIDER_SITE_OTHER): Payer: Self-pay

## 2016-05-31 ENCOUNTER — Other Ambulatory Visit (INDEPENDENT_AMBULATORY_CARE_PROVIDER_SITE_OTHER): Payer: Self-pay | Admitting: Orthopaedic Surgery

## 2016-05-31 DIAGNOSIS — M25552 Pain in left hip: Secondary | ICD-10-CM

## 2016-06-02 ENCOUNTER — Ambulatory Visit (HOSPITAL_COMMUNITY)
Admission: RE | Admit: 2016-06-02 | Discharge: 2016-06-02 | Disposition: A | Discharge status: Home / Self Care | Payer: 59 | Source: Ambulatory Visit | Attending: Orthopaedic Surgery | Admitting: Orthopaedic Surgery

## 2016-06-02 DIAGNOSIS — M25552 Pain in left hip: Secondary | ICD-10-CM | POA: Insufficient documentation

## 2016-06-15 ENCOUNTER — Encounter (INDEPENDENT_AMBULATORY_CARE_PROVIDER_SITE_OTHER): Payer: Self-pay | Admitting: Orthopaedic Surgery

## 2016-06-15 ENCOUNTER — Ambulatory Visit (INDEPENDENT_AMBULATORY_CARE_PROVIDER_SITE_OTHER): Payer: 59 | Admitting: Orthopaedic Surgery

## 2016-06-15 DIAGNOSIS — M7062 Trochanteric bursitis, left hip: Secondary | ICD-10-CM

## 2016-06-15 DIAGNOSIS — M25552 Pain in left hip: Secondary | ICD-10-CM

## 2016-06-15 MED ORDER — METHYLPREDNISOLONE ACETATE 40 MG/ML IJ SUSP
40.0000 mg | INTRAMUSCULAR | Status: AC | PRN
  Administered 2016-06-15: 40 mg via INTRA_ARTICULAR

## 2016-06-15 MED ORDER — LIDOCAINE HCL 1 % IJ SOLN
3.0000 mL | INTRAMUSCULAR | Status: AC | PRN
  Administered 2016-06-15: 3 mL

## 2016-06-15 NOTE — Progress Notes (Signed)
Office Visit Note   Patient: Terry Benson           Date of Birth: [birth year]           MRN: [id number] Visit Date: 06/15/2016              Requested by: Terry Bowen, MD [address] [place], [place] [address] PCP: Terry Stack, MD   Assessment & Plan: Visit Diagnoses:  1. Pain of left hip joint   2. Trochanteric bursitis, left hip     Plan: He tolerated the steroid injection well around the greater trochanteric area on the left side. I do feel that he has a component of trochanteric bursitis but also gluteus medius and minimus tendinitis. I gave him a prescription for outpatient physical therapy. He'll also try topical anti-inflammatories and if this does not improve 1 next step would be to obtain an MRI of his right hip given the fact that he has had remote surgery on that hip with a arthroscopic intervention a labral repair. There is a chance that he may have some prompts the gluteus medius and minimus tendon as well as problems to the cartilage that were not seeing on plain films.  Follow-Up Instructions: Return if symptoms worsen or fail to improve.   Orders:  No orders of the defined types were placed in this encounter.  No orders of the defined types were placed in this encounter.     Procedures: Large Joint Inj Date/Time: 06/15/2016 9:16 AM Performed by: Terry Benson Authorized by: Terry Benson   Location:  Hip Site:  L greater trochanter Ultrasound Guidance: No   Fluoroscopic Guidance: No   Arthrogram: No   Medications:  3 mL lidocaine 1 %; 40 mg methylPREDNISolone acetate 40 MG/ML     Clinical Data: No additional findings.   Subjective: Chief Complaint  Patient presents with  . Left Hip - Pain    Left hip pain, history of Labral tear x 14 yrs ago, surg by Dr. Allena Benson, plain films are normal, x[facility]   Terry Benson is well-known to me. He's been having hip problems for a while now. Work is worse with activities  and his hip is very stiff on the left side. We did obtain recent x-rays from interview. He denies any groin pain. HPI  Review of Systems   Objective: Vital Signs: There were no vitals taken for this visit.  Physical Exam He is alert and oriented 3. Ortho Exam He has tightness with internal/external rotation of his left hip. There is pain over the iliotibial band and the trochanteric area as well as the gluteus medius and minimus tendons. He has pain with extremes of internal x-ray rotation of his left hip. His right hip exam is normal. Specialty Comments:  No specialty comments available.  Imaging: No results found. Plain films of his pelvis and left hip show well-maintained joint space. There is may be just minimal narrowing comparing the left than the right side but this is very minimal. I see no significant articular osteophytes. There may be just some in minimal flattening of the femoral head on the lateral view of the hip but this is all very minimal.  PMFS History: Patient Active Problem List   Diagnosis Date Noted  . Adhesive capsulitis of right shoulder 06/06/2015  . Sinusitis, chronic 08/26/2013  . Labyrinthitis 08/26/2013  . Dysfunction of both eustachian tubes 08/26/2013  . Tobacco dipper 08/26/2013  . Allergic rhinitis 08/26/2013  .  GERD 10/17/2009  . RECTAL BLEEDING 10/17/2009  . REFLUX ESOPHAGITIS, HX OF 10/17/2009   Past Medical History:  Diagnosis Date  . Adhesive capsulitis of right shoulder 06/06/2015  . Allergic rhinitis   . GERD (gastroesophageal reflux disease)   . Internal hemorrhoid    by colonoscopy 2011    Family History  Problem Relation Age of Onset  . Cancer Paternal Aunt     colon  . Cancer Maternal Grandfather     colon    Past Surgical History:  Procedure Laterality Date  . EYE SURGERY     x 2  . HIP SURGERY    . SHOULDER ARTHROSCOPY Right 06/06/2015   Procedure: RIGHT SHOULDER ARTHROSCOPY WITH EXTENSIVE LABRAL DEBRIDEMENT AND  MANIPULATION;  Surgeon: Marchia Bond, MD;  Location: [facility];  Service: Orthopedics;  Laterality: Right;   Social History   Occupational History  . Not on file.   Social History Main Topics  . Smoking status: Never Smoker  . Smokeless tobacco: Current User  . Alcohol use Yes     Comment: occas  . Drug use: No  . Sexual activity: Yes    Partners: Female

## 2016-07-02 ENCOUNTER — Other Ambulatory Visit: Payer: Self-pay | Admitting: *Deleted

## 2016-07-02 MED ORDER — OSELTAMIVIR PHOSPHATE 75 MG PO CAPS: ORAL_CAPSULE | ORAL | 0 refills | Status: DC

## 2016-07-02 MED ORDER — PANTOPRAZOLE SODIUM 40 MG PO TBEC: 40.0000 mg | DELAYED_RELEASE_TABLET | Freq: Two times a day (BID) | ORAL | 11 refills | Status: DC

## 2016-07-02 MED FILL — OSELTAMIVIR PHOS 75 MG CAP: 75 | 5 days supply | Qty: 10 | Fill #0

## 2016-07-02 MED FILL — PANTOPRAZOLE SOD DR 40 MG T: 40 | 30 days supply | Qty: 60 | Fill #0

## 2016-07-29 ENCOUNTER — Telehealth: Payer: Self-pay

## 2016-07-29 DIAGNOSIS — Z0000 Encounter for general adult medical examination without abnormal findings: Secondary | ICD-10-CM

## 2016-07-29 NOTE — Telephone Encounter (Signed)
Labs ordered. Appt scheduled w/ Dr. Larose Kells on 08/27/2016 at 2:30 PM. Staff message sent to Dr. Johnsie Cancel (Pt), informed him to let me know if time/date doesn't work for him.

## 2016-07-29 NOTE — Telephone Encounter (Signed)
-----  Message from Colon Branch, MD sent at 07/28/2016  5:31 PM EST ----- Hi Terry Benson, I don't think I saw the message , but will take care of it. Will put the orders in the computer, please get the labs at your office and will have them ready by the time of your visit. Will call you and schedule the OV ! THX, JP ----- Message ----- From: Terry Hector, MD Sent: 07/28/2016   1:04 PM To: Colon Branch, MD  Sent you a message two weeks ago about office visit for me and labs I need Haven't had any in 3 years ? Did you get it  Labs- CBC PLT, Comprehensive Metabolic, V3K, PSA, ESR RF ANA Fasting lipids  Free after 1:00  2/21, 3/16, 3/23, 3/26 4/4    Thanks  [employer]

## 2016-08-02 ENCOUNTER — Other Ambulatory Visit: Payer: 59 | Admitting: *Deleted

## 2016-08-02 DIAGNOSIS — H8301 Labyrinthitis, right ear: Secondary | ICD-10-CM | POA: Diagnosis not present

## 2016-08-02 DIAGNOSIS — K210 Gastro-esophageal reflux disease with esophagitis: Secondary | ICD-10-CM | POA: Diagnosis not present

## 2016-08-02 DIAGNOSIS — H6983 Other specified disorders of Eustachian tube, bilateral: Secondary | ICD-10-CM | POA: Diagnosis not present

## 2016-08-02 DIAGNOSIS — J301 Allergic rhinitis due to pollen: Secondary | ICD-10-CM | POA: Diagnosis not present

## 2016-08-03 LAB — COMPREHENSIVE METABOLIC PANEL
ALBUMIN: 4.5 g/dL (ref 3.5–5.5)
ALK PHOS: 60 IU/L (ref 39–117)
ALT: 23 IU/L (ref 0–44)
AST: 20 IU/L (ref 0–40)
Albumin/Globulin Ratio: 2 (ref 1.2–2.2)
BUN/Creatinine Ratio: 17 (ref 9–20)
BUN: 23 mg/dL (ref 6–24)
Bilirubin Total: 0.4 mg/dL (ref 0.0–1.2)
CO2: 21 mmol/L (ref 18–29)
CREATININE: 1.36 mg/dL — AB (ref 0.76–1.27)
Calcium: 9.5 mg/dL (ref 8.7–10.2)
Chloride: 99 mmol/L (ref 96–106)
GFR calc non Af Amer: 58 — ABNORMAL LOW (ref >59)
GFR, EST AFRICAN AMERICAN: 67 (ref >59)
Globulin, Total: 2.2 (ref 1.5–4.5)
Glucose: 138 mg/dL — ABNORMAL HIGH (ref 65–99)
Potassium: 4.6 mmol/L (ref 3.5–5.2)
SODIUM: 140 mmol/L (ref 134–144)
TOTAL PROTEIN: 6.7 g/dL (ref 6.0–8.5)

## 2016-08-03 LAB — CBC WITH DIFFERENTIAL/PLATELET
Basophils Absolute: 0 10*3/uL (ref 0.0–0.2)
Basos: 0 %
EOS (ABSOLUTE): 0.2 10*3/uL (ref 0.0–0.4)
EOS: 3 %
HEMOGLOBIN: 15.4 g/dL (ref 13.0–17.7)
Hematocrit: 45 % (ref 37.5–51.0)
IMMATURE GRANS (ABS): 0 10*3/uL (ref 0.0–0.1)
Immature Granulocytes: 1 %
LYMPHS ABS: 1.6 10*3/uL (ref 0.7–3.1)
Lymphs: 28 %
MCH: 29.8 pg (ref 26.6–33.0)
MCHC: 34.2 g/dL (ref 31.5–35.7)
MCV: 87 fL (ref 79–97)
MONOCYTES: 7 %
Monocytes Absolute: 0.4 10*3/uL (ref 0.1–0.9)
Neutrophils Absolute: 3.5 10*3/uL (ref 1.4–7.0)
Neutrophils: 61 %
Platelets: 200 10*3/uL (ref 150–379)
RBC: 5.17 x10E6/uL (ref 4.14–5.80)
RDW: 13.9 % (ref 12.3–15.4)
WBC: 5.7 10*3/uL (ref 3.4–10.8)

## 2016-08-03 LAB — HEMOGLOBIN A1C
Est. average glucose Bld gHb Est-mCnc: 134
Hgb A1c MFr Bld: 6.3 % — ABNORMAL HIGH (ref 4.8–5.6)

## 2016-08-03 LAB — LIPID PANEL
CHOLESTEROL TOTAL: 248 mg/dL — AB (ref 100–199)
Chol/HDL Ratio: 5.9 — ABNORMAL HIGH (ref 0.0–5.0)
HDL: 42 mg/dL (ref >39)
LDL Calculated: 178 — ABNORMAL HIGH (ref 0–99)
TRIGLYCERIDES: 140 mg/dL (ref 0–149)
VLDL Cholesterol Cal: 28 (ref 5–40)

## 2016-08-03 LAB — RHEUMATOID FACTOR

## 2016-08-03 LAB — PSA: Prostate Specific Ag, Serum: 0.9 ng/mL (ref 0.0–4.0)

## 2016-08-03 LAB — ANA: ANA: NEGATIVE

## 2016-08-03 LAB — SEDIMENTATION RATE: SED RATE: 3 mm/h (ref 0–30)

## 2016-08-24 MED FILL — PANTOPRAZOLE SOD DR 40 MG T: 40 | 90 days supply | Qty: 180 | Fill #1

## 2016-08-24 MED FILL — VENTOLIN HFA 90 MCG INHALER: 108 (90 BAS | 25 days supply | Qty: 18 | Fill #1

## 2016-08-27 ENCOUNTER — Encounter: Payer: Self-pay | Admitting: Internal Medicine

## 2016-08-27 ENCOUNTER — Ambulatory Visit (INDEPENDENT_AMBULATORY_CARE_PROVIDER_SITE_OTHER): Payer: 59 | Admitting: Internal Medicine

## 2016-08-27 VITALS — BP 122/66 | HR 71 | Temp 98.1°F | Resp 14 | Ht 69.0 in | Wt 204.0 lb

## 2016-08-27 DIAGNOSIS — R399 Unspecified symptoms and signs involving the genitourinary system: Secondary | ICD-10-CM | POA: Diagnosis not present

## 2016-08-27 DIAGNOSIS — L989 Disorder of the skin and subcutaneous tissue, unspecified: Secondary | ICD-10-CM

## 2016-08-27 DIAGNOSIS — Z0000 Encounter for general adult medical examination without abnormal findings: Secondary | ICD-10-CM

## 2016-08-27 DIAGNOSIS — Z1159 Encounter for screening for other viral diseases: Secondary | ICD-10-CM

## 2016-08-27 DIAGNOSIS — E785 Hyperlipidemia, unspecified: Secondary | ICD-10-CM

## 2016-08-27 DIAGNOSIS — Z114 Encounter for screening for human immunodeficiency virus [HIV]: Secondary | ICD-10-CM

## 2016-08-27 MED ORDER — TAMSULOSIN HCL 0.4 MG PO CAPS: 0.4000 mg | ORAL_CAPSULE | Freq: Every day | ORAL | 6 refills | Status: DC

## 2016-08-27 MED FILL — TAMSULOSIN HCL 0.4 MG CAP: 0.4 | 30 days supply | Qty: 30 | Fill #0

## 2016-08-27 NOTE — Progress Notes (Signed)
Subjective:    Patient ID: Terry Benson, male    DOB: August 02, 1960, 56 y.o.   MRN: [id number]  DOS:  08/27/2016 Type of visit - description : New patient to me. CPX Interval history: In general feels well, he does have some concerns, see review of systems. He remains active least 3 times a week, he does some weight training and stationary bike   Review of Systems He stopped chewing tobacco ~14 months ago, after that he gained some weight. Has on and off left hip pain, he sees orthopedic surgery regularly, last time he was there was diagnosed with trochanteric bursitis and go to local steroid injection which helped. This is again an issue on and off. Has occasional problems with his bladder, some urinary frequency, some difficulty emptying his bladder completely. History of GERD, currently with no symptoms  Other than above, a 14 point review of systems is negative     Past Medical History:  Diagnosis Date  . Adhesive capsulitis of right shoulder 06/06/2015  . Allergic rhinitis   . GERD (gastroesophageal reflux disease)   . Internal hemorrhoid    by colonoscopy 2011    Past Surgical History:  Procedure Laterality Date  . EYE SURGERY     x 2  . HIP SURGERY Left    labrum repair , arthroscopy  . SHOULDER ARTHROSCOPY Right 06/06/2015   Procedure: RIGHT SHOULDER ARTHROSCOPY WITH EXTENSIVE LABRAL DEBRIDEMENT AND MANIPULATION;  Surgeon: Marchia Bond, MD;  Location: [facility];  Service: Orthopedics;  Laterality: Right;    Social History   Social History  . Marital status: Married    Spouse name: N/A  . Number of children: N/A  . Years of education: N/A   Occupational History  . MD    Social History Main Topics  . Smoking status: Never Smoker  . Smokeless tobacco: Former Systems developer     Comment: quit chew ~ 1- 2017  . Alcohol use Yes     Comment: occas  . Drug use: No  . Sexual activity: Yes    Partners: Female   Other Topics Concern  . 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   Family History  Problem Relation Age of Onset  . Cancer Paternal Aunt     colon  . Cancer Maternal Grandfather     colon  . Diabetes Father   . CAD Father   . Lung cancer Father     smoker  . Prostate cancer Neg Hx      Allergies as of 08/27/2016   No Known Allergies     Medication List       Accurate as of 08/27/16 11:59 PM. Always use your most recent med list.          albuterol 108 (90 Base) MCG/ACT inhaler Commonly known as:  PROVENTIL HFA;VENTOLIN HFA Inhale 1-2 puffs into the lungs every 6 (six) hours as needed for wheezing or shortness of breath.   pantoprazole 40 MG tablet Commonly known as:  PROTONIX Take 1 tablet (40 mg total) by mouth 2 (two) times daily.   tamsulosin 0.4 MG Caps capsule Commonly known as:  FLOMAX Take 1 capsule (0.4 mg total) by mouth at bedtime.          Objective:   Physical Exam BP 122/66 (BP Location: Left Arm, Patient Position: Sitting, Cuff Size: Normal)  Pulse 71   Temp 98.1 F (36.7 C) (Oral)   Resp 14   Ht 5\' 9"  (1.753 m)   Wt 204 lb (92.5 kg)   SpO2 97%   BMI 30.13 kg/m  General:   Well developed, well nourished . NAD.  Neck: No  thyromegaly  HEENT:  Normocephalic . Face symmetric, atraumatic Lungs:  CTA B Normal respiratory effort, no intercostal retractions, no accessory muscle use. Heart: RRR,  no murmur.  No pretibial edema bilaterally  Abdomen:  Not distended, soft, non-tender. No rebound or rigidity.   Skin: At the right calf and left hip has a couple of skin lesions, hyperpigmented, 2 or 3 mm in size, slightly elevated, looks like hyperpigmentation but no moles per se Rectal:  External abnormalities: none. Normal sphincter tone. No rectal masses or tenderness.  No stools found  Prostate: Prostate gland firm and smooth, no enlargement, nodularity, tenderness, mass,  asymmetry or induration.  Neurologic:  alert & oriented X3.  Speech normal, gait appropriate for age and unassisted Strength symmetric and appropriate for age.  Psych: Cognition and judgment appear intact.  Cooperative with normal attention span and concentration.  Behavior appropriate. No anxious or depressed appearing.     Assessment & Plan:   Assessment Mild DM:  A1c 6.5 ---2009 Dyslipidemia + FH CAD --- has  a coronary calcium score every 3 years, all have been negative GERD: Multiple EGDs, 07/25/1997 esoph biopsy: Esophagitis, no Barrett's. LUTS MSK: Left hip pain on and off  PLAN: GERD: Status post multiple EGDs, currently asymptomatic with no dysphagia or odynophagia. Biopsy 1999 negative for Barrett's LUTS: Check a UA and urine culture. Flomax? We agreed to do a trial . The REM recent PSA were normal. DM: Last A1c 6.3, doing great size, as long as A1c is less than 6.5 no medications are recommended. A1c in 6 months  High cholesterol: Last LDL 173, he is somewhat reluctant to take medication as he feels his heart is doing okay with normal coronary calcium scores. I still recommend therapy, we agree on Lipitor 10 mg 3 times a week, labs in 2 months. Left hip discomfort,   recommend judicious use of Tylenol, Motrin, stretching. Skin lesions: Refer to Dr. Wilhemina Bonito dermatology, post-inflamatory hyper pigmentation?. Labs 6 weeks from now: FLP, AST-ALT, HIV, hep C. RTC 1 year

## 2016-08-27 NOTE — Assessment & Plan Note (Signed)
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 Next 10 years. --Prostate ca screening: DRE today normal, last PSA normal. No FH Diet and exercise discussed

## 2016-08-27 NOTE — Patient Instructions (Signed)
Please go to the lab and provide a urine sample  Lipitor 10 mg 3 times a week  Labs in 2 months: FLP, AST, ALT, hep C, HIV  Less check A1c in 6 months  Next visit in one year

## 2016-08-27 NOTE — Progress Notes (Signed)
Pre visit review using our clinic review tool, if applicable. No additional management support is needed unless otherwise documented below in the visit note. 

## 2016-08-28 LAB — URINALYSIS, ROUTINE W REFLEX MICROSCOPIC
Bilirubin Urine: NEGATIVE
GLUCOSE, UA: NEGATIVE
HGB URINE DIPSTICK: NEGATIVE
KETONES UR: NEGATIVE
LEUKOCYTES UA: NEGATIVE
NITRITE: NEGATIVE
PROTEIN: NEGATIVE
Specific Gravity, Urine: 1.026 (ref 1.001–1.035)
pH: 7 (ref 5.0–8.0)

## 2016-08-29 DIAGNOSIS — Z09 Encounter for follow-up examination after completed treatment for conditions other than malignant neoplasm: Secondary | ICD-10-CM | POA: Insufficient documentation

## 2016-08-29 LAB — URINE CULTURE: Organism ID, Bacteria: NO GROWTH

## 2016-08-29 MED ORDER — ATORVASTATIN CALCIUM 10 MG PO TABS
10.0000 mg | ORAL_TABLET | Freq: Every day | ORAL | 3 refills | Status: DC
Start: 2016-08-29 — End: 2018-07-06

## 2016-08-29 NOTE — Assessment & Plan Note (Signed)
GERD: Status post multiple EGDs, currently asymptomatic with no dysphagia or odynophagia. Biopsy 1999 negative for Barrett's LUTS: Check a UA and urine culture. Flomax? We agreed to do a trial . The REM recent PSA were normal. DM: Last A1c 6.3, doing great size, as long as A1c is less than 6.5 no medications are recommended. A1c in 6 months  High cholesterol: Last LDL 173, he is somewhat reluctant to take medication as he feels his heart is doing okay with normal coronary calcium scores. I still recommend therapy, we agree on Lipitor 10 mg 3 times a week, labs in 2 months. Left hip discomfort,   recommend judicious use of Tylenol, Motrin, stretching. Skin lesions: Refer to Dr. Wilhemina Bonito dermatology, post-inflamatory hyper pigmentation?. Labs 6 weeks from now: FLP, AST-ALT, HIV, hep C. RTC 1 year

## 2016-08-30 MED FILL — ATORVASTATIN 10 MG TABLET: 10 | 90 days supply | Qty: 90 | Fill #0

## 2016-09-02 DIAGNOSIS — L738 Other specified follicular disorders: Secondary | ICD-10-CM | POA: Diagnosis not present

## 2016-09-02 DIAGNOSIS — D2371 Other benign neoplasm of skin of right lower limb, including hip: Secondary | ICD-10-CM | POA: Diagnosis not present

## 2016-09-02 DIAGNOSIS — D485 Neoplasm of uncertain behavior of skin: Secondary | ICD-10-CM | POA: Diagnosis not present

## 2016-09-03 ENCOUNTER — Other Ambulatory Visit: Payer: Self-pay | Admitting: Nurse Practitioner

## 2016-09-03 MED ORDER — FLUTICASONE PROPIONATE 50 MCG/ACT NA SUSP: 2.0000 | Freq: Every day | NASAL | 2 refills | Status: DC

## 2016-09-03 MED FILL — FLUTICASONE PROP 50 MCG SPR: 50 | 30 days supply | Qty: 16 | Fill #0

## 2016-09-07 MED FILL — DOXYCYCLINE MONO 100 MG CAP: 100 | 10 days supply | Qty: 20 | Fill #0

## 2016-10-20 MED FILL — VENTOLIN HFA 90 MCG INHALER: 108 (90 BAS | 25 days supply | Qty: 18 | Fill #2

## 2016-10-22 ENCOUNTER — Other Ambulatory Visit: Payer: Self-pay | Admitting: Cardiovascular Disease

## 2016-10-22 MED FILL — FLUTICASONE PROP 50 MCG SPR: 50 | 30 days supply | Qty: 16 | Fill #0

## 2016-10-22 NOTE — Telephone Encounter (Signed)
Refill sent to pts confirmed pharmacy of choice.

## 2016-11-12 DIAGNOSIS — S6982XA Other specified injuries of left wrist, hand and finger(s), initial encounter: Secondary | ICD-10-CM | POA: Diagnosis not present

## 2016-11-26 ENCOUNTER — Other Ambulatory Visit (HOSPITAL_COMMUNITY): Payer: Self-pay | Admitting: Orthopedic Surgery

## 2016-11-26 DIAGNOSIS — S6982XA Other specified injuries of left wrist, hand and finger(s), initial encounter: Secondary | ICD-10-CM

## 2016-12-01 ENCOUNTER — Ambulatory Visit (HOSPITAL_COMMUNITY)
Admission: RE | Admit: 2016-12-01 | Discharge: 2016-12-01 | Disposition: A | Discharge status: Home / Self Care | Payer: 59 | Source: Ambulatory Visit | Attending: Orthopedic Surgery | Admitting: Orthopedic Surgery

## 2016-12-01 ENCOUNTER — Encounter (HOSPITAL_COMMUNITY): Payer: Self-pay

## 2016-12-01 DIAGNOSIS — S6982XA Other specified injuries of left wrist, hand and finger(s), initial encounter: Secondary | ICD-10-CM | POA: Diagnosis not present

## 2016-12-01 DIAGNOSIS — M25532 Pain in left wrist: Secondary | ICD-10-CM | POA: Diagnosis not present

## 2017-04-15 DIAGNOSIS — H5202 Hypermetropia, left eye: Secondary | ICD-10-CM | POA: Diagnosis not present

## 2017-04-15 DIAGNOSIS — H524 Presbyopia: Secondary | ICD-10-CM | POA: Diagnosis not present

## 2017-04-15 DIAGNOSIS — H5211 Myopia, right eye: Secondary | ICD-10-CM | POA: Diagnosis not present

## 2017-04-15 DIAGNOSIS — H52223 Regular astigmatism, bilateral: Secondary | ICD-10-CM | POA: Diagnosis not present

## 2017-06-20 MED FILL — PANTOPRAZOLE SOD DR 40 MG T: 40 | 90 days supply | Qty: 180 | Fill #2

## 2017-09-27 ENCOUNTER — Other Ambulatory Visit: Payer: Self-pay | Admitting: Nurse Practitioner

## 2017-09-27 MED ORDER — ALBUTEROL SULFATE HFA 108 (90 BASE) MCG/ACT IN AERS: 1.0000 | INHALATION_SPRAY | Freq: Four times a day (QID) | RESPIRATORY_TRACT | 2 refills | Status: DC | PRN

## 2017-09-27 MED ORDER — ONDANSETRON HCL 4 MG PO TABS: 4.0000 mg | ORAL_TABLET | Freq: Three times a day (TID) | ORAL | 1 refills | Status: DC | PRN

## 2017-09-27 MED ORDER — FLUTICASONE PROPIONATE 50 MCG/ACT NA SUSP: 2.0000 | Freq: Every day | NASAL | 2 refills | Status: DC

## 2017-09-27 MED FILL — VENTOLIN HFA 90 MCG INHALER: 108 (90 BAS | 25 days supply | Qty: 18 | Fill #0

## 2017-09-27 MED FILL — ONDANSETRON HCL 4 MG TABLET: 4 | 3 days supply | Qty: 10 | Fill #0

## 2017-09-27 MED FILL — FLUTICASONE PROP 50 MCG SPR: 50 | 30 days supply | Qty: 16 | Fill #0

## 2018-03-08 ENCOUNTER — Telehealth: Payer: Self-pay | Admitting: Gastroenterology

## 2018-03-08 NOTE — Telephone Encounter (Signed)
Terry Benson,  Dr. Larose Kells and patient requesting an EGD for long-standing heartburn and history of reflux esophagitis.   He is healthy, so I do not feel an office visit is needed, and I would like to arrange a direct book EGD in the [facility].  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  He is available October 10th, a day when I am in [place] all day.  The schedule is full, but I could do a 7:30 EGD, which Dr. Johnsie Cancel is agreeable to doing.  Please set up a nurse visit in clinic or [place] for instructions and sign consent - he is expecting a call, and I will give you his mobile number.  - HD  [place],   [place]

## 2018-03-09 NOTE — Telephone Encounter (Addendum)
Spoke to [name] on 03-08-2018. He will come to the office to sign his consent form and pick up his instruction packet.

## 2018-03-10 ENCOUNTER — Other Ambulatory Visit: Payer: Self-pay

## 2018-03-10 ENCOUNTER — Encounter: Payer: Self-pay | Admitting: Gastroenterology

## 2018-03-10 DIAGNOSIS — K219 Gastro-esophageal reflux disease without esophagitis: Secondary | ICD-10-CM

## 2018-03-23 ENCOUNTER — Encounter: Payer: Self-pay | Admitting: Gastroenterology

## 2018-03-23 ENCOUNTER — Ambulatory Visit (AMBULATORY_SURGERY_CENTER): Payer: 59 | Admitting: Gastroenterology

## 2018-03-23 VITALS — BP 110/68 | HR 67 | Temp 96.8°F | Resp 13 | Ht 69.0 in | Wt 204.0 lb

## 2018-03-23 DIAGNOSIS — K2970 Gastritis, unspecified, without bleeding: Secondary | ICD-10-CM

## 2018-03-23 DIAGNOSIS — K219 Gastro-esophageal reflux disease without esophagitis: Secondary | ICD-10-CM

## 2018-03-23 DIAGNOSIS — K2100 Gastro-esophageal reflux disease with esophagitis, without bleeding: Secondary | ICD-10-CM

## 2018-03-23 DIAGNOSIS — K2210 Ulcer of esophagus without bleeding: Secondary | ICD-10-CM | POA: Diagnosis not present

## 2018-03-23 DIAGNOSIS — K209 Esophagitis, unspecified: Secondary | ICD-10-CM | POA: Diagnosis not present

## 2018-03-23 DIAGNOSIS — K3189 Other diseases of stomach and duodenum: Secondary | ICD-10-CM | POA: Diagnosis not present

## 2018-03-23 DIAGNOSIS — J45909 Unspecified asthma, uncomplicated: Secondary | ICD-10-CM | POA: Diagnosis not present

## 2018-03-23 MED ORDER — PANTOPRAZOLE SODIUM 40 MG PO TBEC: 40.0000 mg | DELAYED_RELEASE_TABLET | Freq: Two times a day (BID) | ORAL | 6 refills | Status: DC

## 2018-03-23 MED ORDER — SODIUM CHLORIDE 0.9 % IV SOLN: 500.0000 mL | Freq: Once | INTRAVENOUS | Status: DC

## 2018-03-23 MED FILL — PANTOPRAZOLE SOD DR 40 MG T: 40 | 90 days supply | Qty: 180 | Fill #0

## 2018-03-23 NOTE — Progress Notes (Signed)
Pt's states no medical or surgical changes since previsit or office visit. 

## 2018-03-23 NOTE — Progress Notes (Signed)
Report given to PACU, vss 

## 2018-03-23 NOTE — Patient Instructions (Signed)
Handouts:  Gastritis and GERD   YOU HAD AN ENDOSCOPIC PROCEDURE TODAY AT [facility]:   Refer to the procedure report that was given to you for any specific questions about what was found during the examination.  If the procedure report does not answer your questions, please call your gastroenterologist to clarify.  If you requested that your care partner not be given the details of your procedure findings, then the procedure report has been included in a sealed envelope for you to review at your convenience later.  YOU SHOULD EXPECT: Some feelings of bloating in the abdomen. Passage of more gas than usual.  Walking can help get rid of the air that was put into your GI tract during the procedure and reduce the bloating. If you had a lower endoscopy (such as a colonoscopy or flexible sigmoidoscopy) you may notice spotting of blood in your stool or on the toilet paper. If you underwent a bowel prep for your procedure, you may not have a normal bowel movement for a few days.  Please Note:  You might notice some irritation and congestion in your nose or some drainage.  This is from the oxygen used during your procedure.  There is no need for concern and it should clear up in a day or so.  SYMPTOMS TO REPORT IMMEDIATELY:   Following upper endoscopy (EGD)  Vomiting of blood or coffee ground material  New chest pain or pain under the shoulder blades  Painful or persistently difficult swallowing  New shortness of breath  Fever of 100F or higher  Black, tarry-looking stools  For urgent or emergent issues, a gastroenterologist can be reached at any hour by calling [phone].   DIET:  We do recommend a small meal at first, but then you may proceed to your regular diet.  Drink plenty of fluids but you should avoid alcoholic beverages for 24 hours.  ACTIVITY:  You should plan to take it easy for the rest of today and you should NOT DRIVE or use heavy machinery until tomorrow  (because of the sedation medicines used during the test).    FOLLOW UP: Our staff will call the number listed on your records the next business day following your procedure to check on you and address any questions or concerns that you may have regarding the information given to you following your procedure. If we do not reach you, we will leave a message.  However, if you are feeling well and you are not experiencing any problems, there is no need to return our call.  We will assume that you have returned to your regular daily activities without incident.  If any biopsies were taken you will be contacted by phone or by letter within the next 1-3 weeks.  Please call us at [phone] if you have not heard about the biopsies in 3 weeks.    SIGNATURES/CONFIDENTIALITY: You and/or your care partner have signed paperwork which will be entered into your electronic medical record.  These signatures attest to the fact that that the information above on your After Visit Summary has been reviewed and is understood.  Full responsibility of the confidentiality of this discharge information lies with you and/or your care-partner.

## 2018-03-23 NOTE — Progress Notes (Signed)
Called to room to assist during endoscopic procedure.  Patient ID and intended procedure confirmed with present staff. Received instructions for my participation in the procedure from the performing physician.  

## 2018-03-23 NOTE — Op Note (Signed)
[facility] Patient Name: Terry Benson Procedure Date: 03/23/2018 7:28 AM MRN: [id number] Endoscopist: Mallie Mussel L. Loletha Carrow , MD Age: 57 Referring MD:  Date of Birth: January 09, 1961 Gender: Male Account #: [id number] Procedure:                Upper GI endoscopy Indications:              Esophageal reflux symptoms that recur despite                            appropriate therapy Medicines:                Monitored Anesthesia Care Procedure:                Pre-Anesthesia Assessment:                           - Prior to the procedure, a History and Physical                            was performed, and patient medications and                            allergies were reviewed. The patient's tolerance of                            previous anesthesia was also reviewed. The risks                            and benefits of the procedure and the sedation                            options and risks were discussed with the patient.                            All questions were answered, and informed consent                            was obtained. Prior Anticoagulants: The patient has                            taken no previous anticoagulant or antiplatelet                            agents. ASA Grade Assessment: II - A patient with                            mild systemic disease. After reviewing the risks                            and benefits, the patient was deemed in                            satisfactory condition to undergo the procedure.  After obtaining informed consent, the endoscope was                            passed under direct vision. Throughout the                            procedure, the patient's blood pressure, pulse, and                            oxygen saturations were monitored continuously. The                            Model GIF-HQ190 [id number]) scope was introduced                            through the mouth, and advanced to the  second part                            of duodenum. The upper GI endoscopy was                            accomplished without difficulty. The patient                            tolerated the procedure well. Scope In: Scope Out: Findings:                 The larynx was normal.                           LA Grade C (one or more mucosal breaks continuous                            between tops of 2 or more mucosal folds, less than                            75% circumference) esophagitis with no bleeding was                            found in the lower third of the esophagus. There                            were several linear areas of salmon-colored mucosa                            within this esophagitis, 2cm in length above upper                            limit of gastric folds - unclear if esophagitis or                            underlying Barrett's. The EGJ mucosa was inflamed.  Multiple biopsies were taken of the distal                            esophagus with a cold forceps for histology.                           A 4 cm hiatal hernia was present (EGJ at 37 cm from                            incisors).                           Patchy mild inflammation characterized by erythema                            was found in the prepyloric region of the stomach.                            Biopsies were taken with a cold forceps for                            histology. ([facility]ney protocol).                           The cardia and gastric fundus were normal on                            retroflexion.                           The examined duodenum was normal. Complications:            No immediate complications. Estimated Blood Loss:     Estimated blood loss was minimal. Impression:               - Normal larynx.                           - LA Grade C reflux esophagitis. Biopsied.                           - 4 cm hiatal hernia.                           -  Gastritis. Biopsied.                           - Normal examined duodenum. Recommendation:           - Patient has a contact number available for                            emergencies. The signs and symptoms of potential                            delayed complications were discussed with the  patient. Return to normal activities tomorrow.                            Written discharge instructions were provided to the                            patient.                           - Resume previous diet.                           - Use Protonix (pantoprazole) 40 mg PO twice daily,                            before meals. Disp #60, RF 6                           - Follow an antireflux regimen indefinitely.                           - Discontinue the use of any products containing                            nicotine.                           - Await pathology results. Xandra Laramee L. Loletha Carrow, MD 03/23/2018 7:58:04 AM This report has been signed electronically.

## 2018-03-24 ENCOUNTER — Telehealth: Payer: Self-pay

## 2018-03-24 NOTE — Telephone Encounter (Signed)
  Follow up Call-  Call back number 03/23/2018 03/23/2018  Post procedure Call Back phone  # [phone] -  Permission to leave phone message Yes Yes  Some recent data might be hidden     Patient questions:  Do you have a fever, pain , or abdominal swelling? No. Pain Score  0 *  Have you tolerated food without any problems? Yes.    Have you been able to return to your normal activities? Yes.    Do you have any questions about your discharge instructions: Diet   No. Medications  No. Follow up visit  No.  Do you have questions or concerns about your Care? No.  Actions: * If pain score is 4 or above: No action needed, pain <4.

## 2018-07-06 ENCOUNTER — Encounter: Payer: Self-pay | Admitting: Internal Medicine

## 2018-07-06 ENCOUNTER — Ambulatory Visit (INDEPENDENT_AMBULATORY_CARE_PROVIDER_SITE_OTHER): Payer: 59 | Admitting: Internal Medicine

## 2018-07-06 VITALS — BP 126/70 | HR 74 | Temp 98.0°F | Resp 16 | Ht 69.0 in | Wt 203.4 lb

## 2018-07-06 DIAGNOSIS — Z1159 Encounter for screening for other viral diseases: Secondary | ICD-10-CM

## 2018-07-06 DIAGNOSIS — Z23 Encounter for immunization: Secondary | ICD-10-CM | POA: Diagnosis not present

## 2018-07-06 DIAGNOSIS — Z0000 Encounter for general adult medical examination without abnormal findings: Secondary | ICD-10-CM

## 2018-07-06 DIAGNOSIS — Z114 Encounter for screening for human immunodeficiency virus [HIV]: Secondary | ICD-10-CM

## 2018-07-06 NOTE — Progress Notes (Signed)
Subjective:    Patient ID: Terry Hector, MD, male    DOB: Jul 19, 1960, 58 y.o.   MRN: [id number]  DOS:  07/06/2018 Type of visit - description:  cpx Here for CPX, in general feeling well.   Review of Systems    A 14 point review of systems is negative    Past Medical History:  Diagnosis Date  . Adhesive capsulitis of right shoulder 06/06/2015  . Allergic rhinitis   . GERD (gastroesophageal reflux disease)   . Internal hemorrhoid    by colonoscopy 2011    Past Surgical History:  Procedure Laterality Date  . EYE SURGERY     x 2  . HIP SURGERY Left    labrum repair , arthroscopy  . SHOULDER ARTHROSCOPY Right 06/06/2015   Procedure: RIGHT SHOULDER ARTHROSCOPY WITH EXTENSIVE LABRAL DEBRIDEMENT AND MANIPULATION;  Surgeon: Marchia Bond, MD;  Location: [facility];  Service: Orthopedics;  Laterality: Right;    Social History   Socioeconomic History  . Marital status: Married    Spouse name: Not on file  . Number of children: Not on file  . Years of education: Not on file  . Highest education level: Not on file  Occupational History  . Occupation: MD  Social Needs  . Financial resource strain: Not on file  . Food insecurity:    Worry: Not on file    Inability: Not on file  . Transportation needs:    Medical: Not on file    Non-medical: Not on file  Tobacco Use  . Smoking status: Never Smoker  . Smokeless tobacco: Former Systems developer  . Tobacco comment: chews   Substance and Sexual Activity  . Alcohol use: Yes    Comment: occas  . Drug use: No  . Sexual activity: Yes    Partners: Female  Lifestyle  . Physical activity:    Days per week: Not on file    Minutes per session: Not on file  . Stress: Not on file  Relationships  . Social connections:    Talks on phone: Not on file    Gets together: Not on file    Attends religious service: Not on file    Active member of club or organization: Not on file    Attends meetings of clubs or organizations:  Not on file    Relationship status: Not on file  . Intimate partner violence:    Fear of current or ex partner: Not on file    Emotionally abused: Not on file    Physically abused: Not on file    Forced sexual activity: Not on file  Other Topics Concern  . 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   -Daughter in college, physical therapy?.    Rides motorcycle for leisure - aware of risks.     Family History  Problem Relation Age of Onset  . Cancer Paternal Aunt        colon  . Cancer Maternal Grandfather        colon  . Diabetes Father   . CAD Father   . Lung cancer Father        smoker  . Prostate cancer Neg Hx      Allergies as of 07/06/2018      Reactions   Flomax [tamsulosin Hcl]       Medication List  Accurate as of July 06, 2018  4:03 PM. Always use your most recent med list.        albuterol 108 (90 Base) MCG/ACT inhaler Commonly known as:  PROVENTIL HFA;VENTOLIN HFA Inhale 1-2 puffs into the lungs every 6 (six) hours as needed for wheezing or shortness of breath.   fluticasone 50 MCG/ACT nasal spray Commonly known as:  FLONASE Place 2 sprays into both nostrils daily.   loratadine 10 MG tablet Commonly known as:  CLARITIN Take 10 mg by mouth daily.   pantoprazole 40 MG tablet Commonly known as:  PROTONIX Take 1 tablet (40 mg total) by mouth 2 (two) times daily.           Objective:   Physical Exam BP 126/70 (BP Location: Left Arm, Patient Position: Sitting, Cuff Size: Small)   Pulse 74   Temp 98 F (36.7 C) (Oral)   Resp 16   Ht 5\' 9"  (1.753 m)   Wt 203 lb 6 oz (92.3 kg)   SpO2 98%   BMI 30.03 kg/m  General: Well developed, NAD, BMI noted Neck: No  thyromegaly  HEENT:  Normocephalic . Face symmetric, atraumatic Lungs:  CTA B Normal respiratory effort, no intercostal retractions, no accessory muscle use. Heart: RRR,  no murmur.    No pretibial edema bilaterally  Abdomen:  Not distended, soft, non-tender. No rebound or rigidity.   Skin: Exposed areas without rash. Not pale. Not jaundice Neurologic:  alert & oriented X3.  Speech normal, gait appropriate for age and unassisted Strength symmetric and appropriate for age.  Psych: Cognition and judgment appear intact.  Cooperative with normal attention span and concentration.  Behavior appropriate. No anxious or depressed appearing.     Assessment     Assessment Mild DM:  A1c 6.5 ---2009 Dyslipidemia Allergies and RAD + FH CAD --- has  a coronary calcium score every 3 years, all have been negative GERD: Multiple EGDs, 07/25/1997 esoph biopsy: Esophagitis, no Barrett's. LUTS MSK: Left hip pain on and off  PLAN: Mild DM: Usually does very well w/ life style, here lately is not as active but plans to increase his physical activity.  His twin brother has diabetes, on 2 oral medicines.  We will check an A1c. Dyslipidemia: Tried Lipitor but eventually stopped.  Will check a FLP.  He is considering PSK 9 inhibitor in the future Allergies, reactive airway disease: On medications as needed GERD: Currently on Protonix 3-4 times a week, symptoms controlled, had an upper endoscopy 03-2018. LUTS: Tried Flomax but developed severe nasal congestion, currently on no medications and essentially asymptomatic. MSK issues: At baseline Skin lesions: Saw dermatology, lesions were benign RTC 1 year

## 2018-07-06 NOTE — Assessment & Plan Note (Signed)
Mild DM: Usually does very well w/ life style, here lately is not as active but plans to increase his physical activity.  His twin brother has diabetes, on 2 oral medicines.  We will check an A1c. Dyslipidemia: Tried Lipitor but eventually stopped.  Will check a FLP.  He is considering PSK 9 inhibitor in the future Allergies, reactive airway disease: On medications as needed GERD: Currently on Protonix 3-4 times a week, symptoms controlled, had an upper endoscopy 03-2018. LUTS: Tried Flomax but developed severe nasal congestion, currently on no medications and essentially asymptomatic. MSK issues: At baseline Skin lesions: Saw dermatology, lesions were benign RTC 1 year

## 2018-07-06 NOTE — Assessment & Plan Note (Addendum)
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 Next 10 years. --Prostate ca screening: DRE, PSA 07/2016. No FH.  Reassessed at the next visit --Diet and exercise discussed --chews tobacco, counseled, risk of oral ca and gum dz discussed ; rec to see dentist q 6 months  --Future labs: CMP, FLP, CBC, A1c, micro, TSH, hep C, HIV

## 2018-07-06 NOTE — Patient Instructions (Addendum)
Please schedule fasting labs at your office within the next few days  Please come to the office in 2 months to get your Shingrix booster  Next visit in 1 year  Please see your dentist every 6 months

## 2018-07-27 ENCOUNTER — Telehealth: Payer: Self-pay

## 2018-07-27 DIAGNOSIS — Z0000 Encounter for general adult medical examination without abnormal findings: Secondary | ICD-10-CM

## 2018-07-27 DIAGNOSIS — Z114 Encounter for screening for human immunodeficiency virus [HIV]: Secondary | ICD-10-CM

## 2018-07-27 DIAGNOSIS — Z1159 Encounter for screening for other viral diseases: Secondary | ICD-10-CM

## 2018-07-27 NOTE — Telephone Encounter (Signed)
Copied from [place] [phone]. Topic: General - Other >> Jul 27, 2018  3:31 PM Virl Axe D wrote: Reason for CRM: [facility] stated pt would like to have his labs done at [facility] tomorrow since he is working however the labs are under [facility] and need to be changed to [facility] because they do not use [facility]. Please advise.

## 2018-07-27 NOTE — Telephone Encounter (Signed)
Labs changed to [facility].

## 2018-07-28 ENCOUNTER — Other Ambulatory Visit: Payer: 59 | Admitting: *Deleted

## 2018-07-28 DIAGNOSIS — Z114 Encounter for screening for human immunodeficiency virus [HIV]: Secondary | ICD-10-CM

## 2018-07-28 DIAGNOSIS — Z1159 Encounter for screening for other viral diseases: Secondary | ICD-10-CM

## 2018-07-28 DIAGNOSIS — E038 Other specified hypothyroidism: Secondary | ICD-10-CM

## 2018-07-28 DIAGNOSIS — Z0000 Encounter for general adult medical examination without abnormal findings: Secondary | ICD-10-CM | POA: Diagnosis not present

## 2018-07-28 DIAGNOSIS — E039 Hypothyroidism, unspecified: Secondary | ICD-10-CM

## 2018-07-28 LAB — MICROALBUMIN / CREATININE URINE RATIO
Creatinine, Urine: 186.9 mg/dL
Microalb/Creat Ratio: 3 mg/g creat (ref 0–29)
Microalbumin, Urine: 5.9 ug/mL

## 2018-07-29 LAB — COMPREHENSIVE METABOLIC PANEL
ALBUMIN: 4.5 g/dL (ref 3.8–4.9)
ALT: 22 IU/L (ref 0–44)
AST: 17 IU/L (ref 0–40)
Albumin/Globulin Ratio: 2 (ref 1.2–2.2)
Alkaline Phosphatase: 73 IU/L (ref 39–117)
BUN / CREAT RATIO: 13 (ref 9–20)
BUN: 17 mg/dL (ref 6–24)
Bilirubin Total: 0.3 mg/dL (ref 0.0–1.2)
CO2: 23 mmol/L (ref 20–29)
Calcium: 9.4 mg/dL (ref 8.7–10.2)
Chloride: 104 mmol/L (ref 96–106)
Creatinine, Ser: 1.35 mg/dL — ABNORMAL HIGH (ref 0.76–1.27)
GFR calc Af Amer: 66 mL/min/{1.73_m2} (ref >59)
GFR calc non Af Amer: 57 mL/min/{1.73_m2} — ABNORMAL LOW (ref >59)
Globulin, Total: 2.3 g/dL (ref 1.5–4.5)
Glucose: 122 mg/dL — ABNORMAL HIGH (ref 65–99)
Potassium: 4.4 mmol/L (ref 3.5–5.2)
Sodium: 143 mmol/L (ref 134–144)
TOTAL PROTEIN: 6.8 g/dL (ref 6.0–8.5)

## 2018-07-29 LAB — CBC WITH DIFFERENTIAL/PLATELET
BASOS: 0 %
Basophils Absolute: 0 10*3/uL (ref 0.0–0.2)
EOS (ABSOLUTE): 0.2 10*3/uL (ref 0.0–0.4)
Eos: 3 %
Hematocrit: 44.5 % (ref 37.5–51.0)
Hemoglobin: 15.2 g/dL (ref 13.0–17.7)
Immature Grans (Abs): 0 10*3/uL (ref 0.0–0.1)
Immature Granulocytes: 0 %
Lymphocytes Absolute: 1.5 10*3/uL (ref 0.7–3.1)
Lymphs: 27 %
MCH: 28.8 pg (ref 26.6–33.0)
MCHC: 34.2 g/dL (ref 31.5–35.7)
MCV: 84 fL (ref 79–97)
Monocytes Absolute: 0.5 10*3/uL (ref 0.1–0.9)
Monocytes: 8 %
NEUTROS PCT: 62 %
Neutrophils Absolute: 3.4 10*3/uL (ref 1.4–7.0)
Platelets: 225 10*3/uL (ref 150–450)
RBC: 5.27 x10E6/uL (ref 4.14–5.80)
RDW: 12.7 % (ref 11.6–15.4)
WBC: 5.6 10*3/uL (ref 3.4–10.8)

## 2018-07-29 LAB — LIPID PANEL
CHOL/HDL RATIO: 5.7 ratio — AB (ref 0.0–5.0)
Cholesterol, Total: 233 mg/dL — ABNORMAL HIGH (ref 100–199)
HDL: 41 mg/dL (ref >39)
LDL Calculated: 161 mg/dL — ABNORMAL HIGH (ref 0–99)
Triglycerides: 155 mg/dL — ABNORMAL HIGH (ref 0–149)
VLDL Cholesterol Cal: 31 mg/dL (ref 5–40)

## 2018-07-29 LAB — HEMOGLOBIN A1C
Est. average glucose Bld gHb Est-mCnc: 140 mg/dL
Hgb A1c MFr Bld: 6.5 % — ABNORMAL HIGH (ref 4.8–5.6)

## 2018-07-29 LAB — HIV ANTIBODY (ROUTINE TESTING W REFLEX): HIV Screen 4th Generation wRfx: NONREACTIVE

## 2018-07-29 LAB — TSH: TSH: 6.67 u[IU]/mL — ABNORMAL HIGH (ref 0.450–4.500)

## 2018-07-29 LAB — HEPATITIS C ANTIBODY: Hep C Virus Ab: <0.1 s/co ratio (ref 0.0–0.9)

## 2018-07-31 NOTE — Addendum Note (Signed)
Addended byDamita Dunnings D on: 07/31/2018 08:46 AM   Modules accepted: Orders

## 2018-08-15 ENCOUNTER — Telehealth: Payer: Self-pay | Admitting: *Deleted

## 2018-08-15 NOTE — Telephone Encounter (Signed)
Pt received 1st Shingrix dose 07/06/18 and will be due for 2nd dose on 09/04/18. Sent mychart message and mailed letter to pt to call and schedule nurse visit for 2nd vaccine. Ok for [facility] to discuss / schedule with pt. Sent [employer] and mailed letter.

## 2018-09-01 ENCOUNTER — Other Ambulatory Visit: Payer: Self-pay

## 2018-09-01 ENCOUNTER — Ambulatory Visit (INDEPENDENT_AMBULATORY_CARE_PROVIDER_SITE_OTHER): Payer: 59

## 2018-09-01 DIAGNOSIS — Z23 Encounter for immunization: Secondary | ICD-10-CM

## 2018-10-10 ENCOUNTER — Encounter: Payer: Self-pay | Admitting: Internal Medicine

## 2018-10-11 ENCOUNTER — Telehealth: Payer: Self-pay

## 2018-10-11 NOTE — Telephone Encounter (Signed)
Patient called to say he has been waiting for office notes to be sent to his insurance company. Sent documents to [place]: [employer].

## 2018-11-30 MED FILL — PANTOPRAZOLE SOD DR 40 MG T: 40 | 90 days supply | Qty: 180 | Fill #1

## 2019-03-15 ENCOUNTER — Other Ambulatory Visit: Payer: Self-pay | Admitting: Student

## 2019-03-15 ENCOUNTER — Other Ambulatory Visit (HOSPITAL_COMMUNITY): Payer: Self-pay | Admitting: Student

## 2019-03-15 DIAGNOSIS — M5416 Radiculopathy, lumbar region: Secondary | ICD-10-CM

## 2019-03-15 DIAGNOSIS — M25552 Pain in left hip: Secondary | ICD-10-CM

## 2019-03-16 ENCOUNTER — Ambulatory Visit (HOSPITAL_COMMUNITY)
Admission: RE | Admit: 2019-03-16 | Discharge: 2019-03-16 | Disposition: A | Discharge status: Home / Self Care | Payer: 59 | Source: Ambulatory Visit | Attending: Student | Admitting: Student

## 2019-03-16 ENCOUNTER — Other Ambulatory Visit: Payer: Self-pay

## 2019-03-16 DIAGNOSIS — M1612 Unilateral primary osteoarthritis, left hip: Secondary | ICD-10-CM | POA: Diagnosis not present

## 2019-03-16 DIAGNOSIS — M25552 Pain in left hip: Secondary | ICD-10-CM | POA: Diagnosis not present

## 2019-03-16 DIAGNOSIS — M48061 Spinal stenosis, lumbar region without neurogenic claudication: Secondary | ICD-10-CM | POA: Diagnosis not present

## 2019-03-16 DIAGNOSIS — M5416 Radiculopathy, lumbar region: Secondary | ICD-10-CM | POA: Insufficient documentation

## 2019-03-16 DIAGNOSIS — M5126 Other intervertebral disc displacement, lumbar region: Secondary | ICD-10-CM | POA: Diagnosis not present

## 2019-03-26 ENCOUNTER — Telehealth: Payer: Self-pay | Admitting: Radiology

## 2019-03-26 NOTE — Telephone Encounter (Signed)
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 Junius Roads on the same day. States his hip is very painful and wants to be sure to have hip injection the same day as evaluation.  [phone]

## 2019-03-26 NOTE — Telephone Encounter (Signed)
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  I am seeing him on Wednesday, October 28 in the office.  I will be gone over his MRI with him that day and talk about hip replacement surgery.  He is wanting to delay hip replacement surgery for several months so he would like to have an intra-articular steroid injection that day as well.  Are you in the office on October 28?  If so, I will have you see him in place a steroid injection in his left hip that day as well if that is okay with you.

## 2019-03-27 NOTE — Telephone Encounter (Signed)
Yes, no problem!

## 2019-03-29 ENCOUNTER — Ambulatory Visit: Payer: 59 | Admitting: Orthopaedic Surgery

## 2019-04-11 ENCOUNTER — Ambulatory Visit: Payer: 59 | Admitting: Orthopaedic Surgery

## 2019-04-13 ENCOUNTER — Ambulatory Visit (INDEPENDENT_AMBULATORY_CARE_PROVIDER_SITE_OTHER): Payer: 59 | Admitting: Family Medicine

## 2019-04-13 ENCOUNTER — Encounter: Payer: Self-pay | Admitting: Family Medicine

## 2019-04-13 ENCOUNTER — Other Ambulatory Visit: Payer: Self-pay

## 2019-04-13 ENCOUNTER — Ambulatory Visit: Payer: Self-pay

## 2019-04-13 DIAGNOSIS — M1612 Unilateral primary osteoarthritis, left hip: Secondary | ICD-10-CM | POA: Diagnosis not present

## 2019-04-13 NOTE — Progress Notes (Signed)
Office Visit Note   Patient: Terry Hector, MD           Date of Birth: 18-Oct-1960           MRN: [id number] Visit Date: 04/13/2019 Requested by: Colon Branch, [address] [place],  [place] [address] PCP: Colon Branch, MD  Subjective: Chief Complaint  Patient presents with  . Left Hip - Pain    US-guided hip injection per Dr. Ninfa Linden    HPI: He is here with left hip pain.  Longstanding problems with his hip related to a labral tear many years ago.  MRI scan earlier this year showed moderate osteoarthritis with a cam deformity.  He would like to avoid hip replacement for at least another year if possible, and is hoping an injection will help.  He has had a greater trochanter injection in the past with some improvement.  Most of his pain is in the groin area with some pain in the buttocks.               ROS: No fevers or chills.  All other systems were reviewed and are negative.  Objective: Vital Signs: There were no vitals taken for this visit.  Physical Exam:  General:  Alert and oriented, in no acute distress. Pulm:  Breathing unlabored. Psy:  Normal mood, congruent affect. Skin: No rash on his skin. Left hip: Slightly limited internal rotation with some pain at the extreme.  Both hips are rather tight with external rotation.   Imaging: None today other than for needle guidance  Assessment & Plan: 1.  Left hip osteoarthritis -Ultrasound-guided injection today.  Could contemplate prolotherapy with dextrose in the future if he does not get long-term relief.     Procedures: Procedure: Ultrasound-guided left hip injection: After sterile prep with Betadine, injected 8 cc 1% lidocaine without epinephrine and 40 mg methylprednisolone using a 22-gauge spinal needle, passing the needle through the iliofemoral ligament into the femoral head/neck junction.  Injectate seen filling joint capsule.  Modest improvement in pain during anesthetic phase.       PMFS  History: Patient Active Problem List   Diagnosis Date Noted  . PCP NOTES >>>>>>>>>>>> 08/29/2016  . Annual physical exam 08/27/2016  . Adhesive capsulitis of right shoulder 06/06/2015  . Sinusitis, chronic 08/26/2013  . Labyrinthitis 08/26/2013  . Dysfunction of both eustachian tubes 08/26/2013  . Allergic rhinitis 08/26/2013  . GERD 10/17/2009  . RECTAL BLEEDING 10/17/2009  . REFLUX ESOPHAGITIS, HX OF 10/17/2009   Past Medical History:  Diagnosis Date  . Adhesive capsulitis of right shoulder 06/06/2015  . Allergic rhinitis   . GERD (gastroesophageal reflux disease)   . Internal hemorrhoid    by colonoscopy 2011    Family History  Problem Relation Age of Onset  . Cancer Paternal Aunt        colon  . Cancer Maternal Grandfather        colon  . Diabetes Father   . CAD Father   . Lung cancer Father        smoker  . Prostate cancer Neg Hx     Past Surgical History:  Procedure Laterality Date  . EYE SURGERY     x 2  . HIP SURGERY Left    labrum repair , arthroscopy  . SHOULDER ARTHROSCOPY Right 06/06/2015   Procedure: RIGHT SHOULDER ARTHROSCOPY WITH EXTENSIVE LABRAL DEBRIDEMENT AND MANIPULATION;  Surgeon: Marchia Bond, MD;  Location: [facility];  Service: Orthopedics;  Laterality: Right;   Social History   Occupational History  . Occupation: MD  Tobacco Use  . Smoking status: Never Smoker  . Smokeless tobacco: Former Systems developer  . Tobacco comment: chews   Substance and Sexual Activity  . Alcohol use: Yes    Comment: occas  . Drug use: No  . Sexual activity: Yes    Partners: Female

## 2019-04-17 DIAGNOSIS — H5202 Hypermetropia, left eye: Secondary | ICD-10-CM | POA: Diagnosis not present

## 2019-04-17 DIAGNOSIS — H524 Presbyopia: Secondary | ICD-10-CM | POA: Diagnosis not present

## 2019-04-17 DIAGNOSIS — H5211 Myopia, right eye: Secondary | ICD-10-CM | POA: Diagnosis not present

## 2019-04-17 DIAGNOSIS — H52223 Regular astigmatism, bilateral: Secondary | ICD-10-CM | POA: Diagnosis not present

## 2019-08-06 ENCOUNTER — Encounter: Payer: Self-pay | Admitting: Internal Medicine

## 2019-10-09 MED FILL — ALBUTEROL SULFATE HFA 108 (: 108 (90 BAS | 25 days supply | Qty: 18 | Fill #0

## 2019-10-30 ENCOUNTER — Telehealth: Payer: Self-pay | Admitting: Gastroenterology

## 2019-10-30 NOTE — Telephone Encounter (Signed)
Request has been sent to [employer]. They will reach out to him and set up the shipment.  Logged in [employer]. Will await results.

## 2019-10-30 NOTE — Telephone Encounter (Signed)
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  He was recently on my recall list for screening colonoscopy.  I recently saw him at work, we discussed it, and he would like to do a cologuard.  Please place the order and put on tracking list for result.  - HD

## 2019-11-06 DIAGNOSIS — Z1212 Encounter for screening for malignant neoplasm of rectum: Secondary | ICD-10-CM | POA: Diagnosis not present

## 2019-11-06 DIAGNOSIS — Z1211 Encounter for screening for malignant neoplasm of colon: Secondary | ICD-10-CM | POA: Diagnosis not present

## 2019-11-13 LAB — COLOGUARD
COLOGUARD: NEGATIVE
Cologuard: NEGATIVE

## 2019-11-14 ENCOUNTER — Other Ambulatory Visit: Payer: Self-pay

## 2020-01-26 ENCOUNTER — Encounter: Payer: Self-pay | Admitting: Internal Medicine

## 2020-03-03 MED FILL — FLUARIX QUADRIVALENT 0.5 ML: 0.5 | 1 days supply | Qty: 1 | Fill #0

## 2020-04-02 ENCOUNTER — Other Ambulatory Visit: Payer: Self-pay

## 2020-04-02 ENCOUNTER — Encounter: Payer: Self-pay | Admitting: Family Medicine

## 2020-04-02 ENCOUNTER — Ambulatory Visit (INDEPENDENT_AMBULATORY_CARE_PROVIDER_SITE_OTHER): Payer: 59 | Admitting: Family Medicine

## 2020-04-02 ENCOUNTER — Ambulatory Visit: Payer: Self-pay

## 2020-04-02 DIAGNOSIS — M1612 Unilateral primary osteoarthritis, left hip: Secondary | ICD-10-CM | POA: Diagnosis not present

## 2020-04-02 NOTE — Progress Notes (Signed)
Subjective: He is here with recurrent left hip pain.  His last injection was October 2020 and he had good results until the past couple months.  Patient is here for ultrasound-guided intra-articular left hip injection.     Objective: Pain with internal rotation.  Impression: Left hip osteoarthritis  Plan: Repeat steroid injection today.  Could contemplate PRP in the future if he gets only short-term relief.  Procedure: Ultrasound-guided left hip injection: After sterile prep with Betadine, injected 8 cc 1% lidocaine without epinephrine and 40 mg methylprednisolone using a 22-gauge spinal needle, passing the needle through the iliofemoral ligament into the femoral head/neck junction.  Injectate seen filling the joint capsule.

## 2020-04-08 ENCOUNTER — Other Ambulatory Visit (HOSPITAL_COMMUNITY): Payer: Self-pay | Admitting: Optometry

## 2020-04-08 DIAGNOSIS — H5202 Hypermetropia, left eye: Secondary | ICD-10-CM | POA: Diagnosis not present

## 2020-04-08 DIAGNOSIS — H52223 Regular astigmatism, bilateral: Secondary | ICD-10-CM | POA: Diagnosis not present

## 2020-04-08 DIAGNOSIS — H524 Presbyopia: Secondary | ICD-10-CM | POA: Diagnosis not present

## 2020-04-08 DIAGNOSIS — H5211 Myopia, right eye: Secondary | ICD-10-CM | POA: Diagnosis not present

## 2020-04-08 MED FILL — CEPHALEXIN 500 MG CAPSULE: 500 | 10 days supply | Qty: 20 | Fill #0

## 2020-04-08 MED FILL — AZASITE 1% EYE DROPS: 1 | 13 days supply | Qty: 3 | Fill #0

## 2020-05-28 ENCOUNTER — Encounter: Payer: Self-pay | Admitting: Internal Medicine

## 2020-07-21 ENCOUNTER — Ambulatory Visit (INDEPENDENT_AMBULATORY_CARE_PROVIDER_SITE_OTHER): Payer: 59

## 2020-07-21 ENCOUNTER — Ambulatory Visit: Payer: 59 | Admitting: Orthopaedic Surgery

## 2020-07-21 ENCOUNTER — Telehealth: Payer: Self-pay

## 2020-07-21 DIAGNOSIS — M1612 Unilateral primary osteoarthritis, left hip: Secondary | ICD-10-CM

## 2020-07-21 NOTE — Telephone Encounter (Signed)
Dr. 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

## 2020-07-21 NOTE — Progress Notes (Signed)
Office Visit Note   Patient: Terry Hector, MD           Date of Birth: 01-29-1961           MRN: [id number] Visit Date: 07/21/2020              Requested by: Colon Branch, [address] [place],   [id number] PCP: Colon Branch, MD   Assessment & Plan: Visit Diagnoses:  1. Osteoarthritis of left hip, unspecified osteoarthritis type   2. Unilateral primary osteoarthritis, left hip     Plan: We again went over the treatment plan for his left hip arthroplasty.  This is scheduled for March 4.  He would like this to be done as a same-day surgery.  We talked in detail about the surgery and what to expect that day.  All questions and concerns were answered and addressed.  I will see him back in just 1 week postoperative because of 2 weeks postoperative I am out of town.  We still may see him after that first week as well but it was worth going ahead and setting up an appointment.  We will have our case manager work on getting things set up for him for home since he is a same-day surgery as well.  Follow-Up Instructions: Return for 1 week post-op.   Orders:  Orders Placed This Encounter  Procedures   XR HIP UNILAT W OR W/O PELVIS 1V LEFT   No orders of the defined types were placed in this encounter.     Procedures: No procedures performed   Clinical Data: No additional findings.   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  He has significant arthritis involving his left hip this been well-documented.  This is worsened with time.  He is developed worsening groin pain and stiffness as well with his left hip.  We actually have him scheduled for a left total hip arthroplasty on March 4 of this year.  He is otherwise active and healthy individual.  He is an avid motorcycle rider as well.  He has more difficulty putting his shoes and socks on on the left side.  At this point his left hip  pain is definitely affecting his mobility, his quality of life and his actives daily living.  His pain is daily.  It is affecting his gait as well.  He is not a diabetic and not a smoker.  HPI  Review of Systems There is currently listed no headache, chest pain, shortness of breath, fever, chills, nausea, vomiting  Objective: Vital Signs: There were no vitals taken for this visit.  Physical Exam He is alert and oriented x3 and in no acute distress Ortho Exam Examination of his left hip shows significant stiffness with rotation.  There is a lot of pain in the groin with internal and external rotation.  His right hip is normal exam.  When I have him lay in a supine position he is just slightly shorter on his left side than the right side. Specialty Comments:  No specialty comments available.  Imaging: XR HIP UNILAT W OR W/O PELVIS 1V LEFT  Result Date: 07/21/2020 An AP pelvis and lateral left hip shows significant arthritis of the left hip.  There is joint space narrowing as well as evidence of previous femoral acetabular impingement.  There are para-articular osteophytes around the femoral  head as well as cystic changes in the superior lateral acetabulum.  This has worsened from previous films over the years.    PMFS History: Patient Active Problem List   Diagnosis Date Noted   Unilateral primary osteoarthritis, left hip 07/21/2020   PCP NOTES >>>>>>>>>>>> 08/29/2016   Annual physical exam 08/27/2016   Adhesive capsulitis of right shoulder 06/06/2015   Sinusitis, chronic 08/26/2013   Labyrinthitis 08/26/2013   Dysfunction of both eustachian tubes 08/26/2013   Allergic rhinitis 08/26/2013   GERD 10/17/2009   RECTAL BLEEDING 10/17/2009   REFLUX ESOPHAGITIS, HX OF 10/17/2009   Past Medical History:  Diagnosis Date   Adhesive capsulitis of right shoulder 06/06/2015   Allergic rhinitis    GERD (gastroesophageal reflux disease)    Internal hemorrhoid    by  colonoscopy 2011    Family History  Problem Relation Age of Onset   Cancer Paternal Aunt        colon   Cancer Maternal Grandfather        colon   Diabetes Father    CAD Father    Lung cancer Father        smoker   Prostate cancer Neg Hx     Past Surgical History:  Procedure Laterality Date   EYE SURGERY     x 2   HIP SURGERY Left    labrum repair , arthroscopy   SHOULDER ARTHROSCOPY Right 06/06/2015   Procedure: RIGHT SHOULDER ARTHROSCOPY WITH EXTENSIVE LABRAL DEBRIDEMENT AND MANIPULATION;  Surgeon: Marchia Bond, MD;  Location: [facility];  Service: Orthopedics;  Laterality: Right;   Social History   Occupational History   Occupation: MD  Tobacco Use   Smoking status: Never Smoker   Smokeless tobacco: Former Systems developer   Tobacco comment: chews   Substance and Sexual Activity   Alcohol use: Yes    Comment: occas   Drug use: No   Sexual activity: Yes    Partners: Female

## 2020-07-21 NOTE — Telephone Encounter (Signed)
I'll work on this when I return to the office. Thanks!

## 2020-07-31 DIAGNOSIS — Z1159 Encounter for screening for other viral diseases: Secondary | ICD-10-CM | POA: Diagnosis not present

## 2020-08-05 ENCOUNTER — Other Ambulatory Visit: Payer: Self-pay | Admitting: Physician Assistant

## 2020-08-06 ENCOUNTER — Other Ambulatory Visit: Payer: Self-pay

## 2020-08-08 NOTE — Patient Instructions (Addendum)
DUE TO COVID-19 ONLY ONE VISITOR IS ALLOWED TO COME WITH YOU AND STAY IN THE WAITING ROOM ONLY DURING PRE OP AND PROCEDURE.   IF YOU WILL BE ADMITTED INTO THE HOSPITAL YOU ARE ALLOWED ONE SUPPORT PERSON DURING VISITATION HOURS ONLY (10AM -8PM)   . The support person may change daily. . The support person must pass our screening, gel in and out, and wear a mask at all times, including in the patient's room. . Patients must also wear a mask when staff or their support person are in the room.   COVID SWAB TESTING MUST BE COMPLETED ON:  Tuesday, 08-12-20 @ 11:00 AM   [address] [place], [place] [address]  (Must self quarantine after testing. Follow instructions on handout.)    Your procedure is scheduled on: Friday, 08-15-20   Report to [facility] Main  Entrance   Report to Short Stay at 5:30 AM   [facility])    Call this number if you have problems the morning of surgery [phone]   Do not eat food :After Midnight.   May have liquids until 4:15 AM day of surgery  CLEAR LIQUID DIET  Foods Allowed                                                                     Foods Excluded  Water, Black Coffee and tea, regular and decaf              liquids that you cannot  Plain Jell-O in any flavor  (No red)                                    see through such as: Fruit ices (not with fruit pulp)                                      milk, soups, orange juice              Iced Popsicles (No red)                                      All solid food                                   Apple juices Sports drinks like Gatorade (No red) Lightly seasoned clear broth or consume(fat free) Sugar, honey syrup     Complete one Ensure drink the morning of surgery at  4:15 AM  the day of surgery.      1. The day of surgery:  ? Drink ONE (1) Pre-Surgery Clear Ensure or G2 by am the morning of surgery. Drink in one sitting. Do not sip.  ? This drink was given to you during your hospital   pre-op appointment visit. ? Nothing else to drink after completing the  Pre-Surgery Clear Ensure or G2.          If you have  questions, please contact your surgeon's office.     Oral Hygiene is also important to reduce your risk of infection.                                    Remember - BRUSH YOUR TEETH THE MORNING OF SURGERY WITH YOUR REGULAR TOOTHPASTE   Do NOT smoke after Midnight   Take these medicines the morning of surgery with A SIP OF WATER: Famotidine, Loratadine                                You may not have any metal on your body including  jewelry, and body piercings             Do not wear  lotions, powders, perfumes/cologne, or deodorant             Men may shave face and neck.   Do not bring valuables to the hospital. [place].   Contacts, dentures or bridgework may not be worn into surgery.     Patients discharged the day of surgery will not be allowed to drive home.   Special Instructions: Bring a copy of your healthcare power of attorney and living will documents         the day of surgery if you haven't  scanned them in before.              Please read over the following fact sheets you were given: IF YOU HAVE QUESTIONS ABOUT YOUR PRE OP INSTRUCTIONS PLEASE CALL  [facility] - Preparing for Surgery Before surgery, you can play an important role.  Because skin is not sterile, your skin needs to be as free of germs as possible.  You can reduce the number of germs on your skin by washing with CHG (chlorahexidine gluconate) soap before surgery.  CHG is an antiseptic cleaner which kills germs and bonds with the skin to continue killing germs even after washing. Please DO NOT use if you have an allergy to CHG or antibacterial soaps.  If your skin becomes reddened/irritated stop using the CHG and inform your nurse when you arrive at Short Stay. Do not shave (including legs and underarms) for at  least 48 hours prior to the first CHG shower.  You may shave your face/neck.  Please follow these instructions carefully:  1.  Shower with CHG Soap the night before surgery and the  morning of surgery.  2.  If you choose to wash your hair, wash your hair first as usual with your normal  shampoo.  3.  After you shampoo, rinse your hair and body thoroughly to remove the shampoo.                             4.  Use CHG as you would any other liquid soap.  You can apply chg directly to the skin and wash.  Gently with a scrungie or clean washcloth.  5.  Apply the CHG Soap to your body ONLY FROM THE NECK DOWN.   Do   not use on face/ open  Wound or open sores. Avoid contact with eyes, ears mouth and   genitals (private parts).                       Wash face,  Genitals (private parts) with your normal soap.             6.  Wash thoroughly, paying special attention to the area where your    surgery  will be performed.  7.  Thoroughly rinse your body with warm water from the neck down.  8.  DO NOT shower/wash with your normal soap after using and rinsing off the CHG Soap.                9.  Pat yourself dry with a clean towel.            10.  Wear clean pajamas.            11.  Place clean sheets on your bed the night of your first shower and do not  sleep with pets. Day of Surgery : Do not apply any lotions/deodorants the morning of surgery.  Please wear clean clothes to the hospital/surgery center.  FAILURE TO FOLLOW THESE INSTRUCTIONS MAY RESULT IN THE CANCELLATION OF YOUR SURGERY  PATIENT SIGNATURE_________________________________  NURSE SIGNATURE__________________________________  ________________________________________________________________________   Terry Benson  An incentive spirometer is a tool that can help keep your lungs clear and active. This tool measures how well you are filling your lungs with each breath. Taking long deep breaths may help  reverse or decrease the chance of developing breathing (pulmonary) problems (especially infection) following:  A long period of time when you are unable to move or be active. BEFORE THE PROCEDURE   If the spirometer includes an indicator to show your best effort, your nurse or respiratory therapist will set it to a desired goal.  If possible, sit up straight or lean slightly forward. Try not to slouch.  Hold the incentive spirometer in an upright position. INSTRUCTIONS FOR USE  1. Sit on the edge of your bed if possible, or sit up as far as you can in bed or on a chair. 2. Hold the incentive spirometer in an upright position. 3. Breathe out normally. 4. Place the mouthpiece in your mouth and seal your lips tightly around it. 5. Breathe in slowly and as deeply as possible, raising the piston or the ball toward the top of the column. 6. Hold your breath for 3-5 seconds or for as long as possible. Allow the piston or ball to fall to the bottom of the column. 7. Remove the mouthpiece from your mouth and breathe out normally. 8. Rest for a few seconds and repeat Steps 1 through 7 at least 10 times every 1-2 hours when you are awake. Take your time and take a few normal breaths between deep breaths. 9. The spirometer may include an indicator to show your best effort. Use the indicator as a goal to work toward during each repetition. 10. After each set of 10 deep breaths, practice coughing to be sure your lungs are clear. If you have an incision (the cut made at the time of surgery), support your incision when coughing by placing a pillow or rolled up towels firmly against it. Once you are able to get out of bed, walk around indoors and cough well. You may stop using the incentive spirometer when instructed by your caregiver.  RISKS AND COMPLICATIONS  Take your time  so you do not get dizzy or light-headed.  If you are in pain, you may need to take or ask for pain medication before doing incentive  spirometry. It is harder to take a deep breath if you are having pain. AFTER USE  Rest and breathe slowly and easily.  It can be helpful to keep track of a log of your progress. Your caregiver can provide you with a simple table to help with this. If you are using the spirometer at home, follow these instructions: [place] IF:   You are having difficultly using the spirometer.  You have trouble using the spirometer as often as instructed.  Your pain medication is not giving enough relief while using the spirometer.  You develop fever of 100.5 F (38.1 C) or higher. SEEK IMMEDIATE MEDICAL CARE IF:   You cough up bloody sputum that had not been present before.  You develop fever of 102 F (38.9 C) or greater.  You develop worsening pain at or near the incision site. MAKE SURE YOU:   Understand these instructions.  Will watch your condition.  Will get help right away if you are not doing well or get worse. Document Released: 10/11/2006 Document Revised: 08/23/2011 Document Reviewed: 12/12/2006 Exit[employer] Patient Information 2014 [employer], [place].   ________________________________________________________________________  WHAT IS A BLOOD TRANSFUSION? Blood Transfusion Information  A transfusion is the replacement of blood or some of its parts. Blood is made up of multiple cells which provide different functions.  Red blood cells carry oxygen and are used for blood loss replacement.  White blood cells fight against infection.  Platelets control bleeding.  Plasma helps clot blood.  Other blood products are available for specialized needs, such as hemophilia or other clotting disorders. BEFORE THE TRANSFUSION  Who gives blood for transfusions?   Healthy volunteers who are fully evaluated to make sure their blood is safe. This is blood bank blood. Transfusion therapy is the safest it has ever been in the practice of medicine. Before blood is taken from a donor, a  complete history is taken to make sure that person has no history of diseases nor engages in risky social behavior (examples are intravenous drug use or sexual activity with multiple partners). The donor's travel history is screened to minimize risk of transmitting infections, such as malaria. The donated blood is tested for signs of infectious diseases, such as HIV and hepatitis. The blood is then tested to be sure it is compatible with you in order to minimize the chance of a transfusion reaction. If you or a relative donates blood, this is often done in anticipation of surgery and is not appropriate for emergency situations. It takes many days to process the donated blood. RISKS AND COMPLICATIONS Although transfusion therapy is very safe and saves many lives, the main dangers of transfusion include:   Getting an infectious disease.  Developing a transfusion reaction. This is an allergic reaction to something in the blood you were given. Every precaution is taken to prevent this. The decision to have a blood transfusion has been considered carefully by your caregiver before blood is given. Blood is not given unless the benefits outweigh the risks. AFTER THE TRANSFUSION  Right after receiving a blood transfusion, you will usually feel much better and more energetic. This is especially true if your red blood cells have gotten low (anemic). The transfusion raises the level of the red blood cells which carry oxygen, and this usually causes an energy increase.  The  nurse administering the transfusion will monitor you carefully for complications. HOME CARE INSTRUCTIONS  No special instructions are needed after a transfusion. You may find your energy is better. Speak with your caregiver about any limitations on activity for underlying diseases you may have. SEEK MEDICAL CARE IF:   Your condition is not improving after your transfusion.  You develop redness or irritation at the intravenous (IV)  site. SEEK IMMEDIATE MEDICAL CARE IF:  Any of the following symptoms occur over the next 12 hours:  Shaking chills.  You have a temperature by mouth above 102 F (38.9 C), not controlled by medicine.  Chest, back, or muscle pain.  People around you feel you are not acting correctly or are confused.  Shortness of breath or difficulty breathing.  Dizziness and fainting.  You get a rash or develop hives.  You have a decrease in urine output.  Your urine turns a dark color or changes to pink, red, or brown. Any of the following symptoms occur over the next 10 days:  You have a temperature by mouth above 102 F (38.9 C), not controlled by medicine.  Shortness of breath.  Weakness after normal activity.  The white part of the eye turns yellow (jaundice).  You have a decrease in the amount of urine or are urinating less often.  Your urine turns a dark color or changes to pink, red, or brown. Document Released: 05/28/2000 Document Revised: 08/23/2011 Document Reviewed: 01/15/2008 [facility] Patient Information 2014 [place], [place].  _______________________________________________________________________

## 2020-08-08 NOTE — Progress Notes (Addendum)
COVID Vaccine Completed:  x2 Date COVID Vaccine completed:  06-06-19, 06-27-19 Has received booster:  03-28-20 COVID vaccine manufacturer: Pfizer       Date of COVID positive in last 90 days: N/A   PCP - Kathlene November, MD Cardiologist - N/A  Chest x-ray - N/A EKG - N/A Stress Test -  ECHO -  Cardiac Cath -  Pacemaker/ICD device last checked:  Sleep Study - N/A CPAP -   Fasting Blood Sugar - N/A Checks Blood Sugar _____ times a day  Blood Thinner Instructions:N/A Aspirin Instructions: Last Dose:  Activity level:   Can go up a flight of stairs and perform activities of daily living without stopping and without symptoms of chest pain or SOB     Anesthesia review: N/A   Patient denies shortness of breath, fever, cough and chest pain at PAT appointment   Patient verbalized understanding of instructions that were given to them at the PAT appointment. Patient was also instructed that they will need to review over the PAT instructions again at home before surgery.

## 2020-08-11 ENCOUNTER — Encounter (HOSPITAL_COMMUNITY)
Admission: RE | Admit: 2020-08-11 | Discharge: 2020-08-11 | Disposition: A | Discharge status: Home / Self Care | Payer: 59 | Source: Ambulatory Visit | Attending: Orthopaedic Surgery | Admitting: Orthopaedic Surgery

## 2020-08-11 ENCOUNTER — Other Ambulatory Visit: Payer: Self-pay

## 2020-08-11 ENCOUNTER — Encounter (HOSPITAL_COMMUNITY): Payer: Self-pay

## 2020-08-11 DIAGNOSIS — Z01812 Encounter for preprocedural laboratory examination: Secondary | ICD-10-CM | POA: Insufficient documentation

## 2020-08-11 LAB — CBC
HCT: 44.2 % (ref 39.0–52.0)
Hemoglobin: 14.5 g/dL (ref 13.0–17.0)
MCH: 28.5 pg (ref 26.0–34.0)
MCHC: 32.8 g/dL (ref 30.0–36.0)
MCV: 87 fL (ref 80.0–100.0)
Platelets: 192 10*3/uL (ref 150–400)
RBC: 5.08 MIL/uL (ref 4.22–5.81)
RDW: 12.9 % (ref 11.5–15.5)
WBC: 5.3 10*3/uL (ref 4.0–10.5)
nRBC: 0 % (ref 0.0–0.2)

## 2020-08-11 LAB — SURGICAL PCR SCREEN
MRSA, PCR: NEGATIVE
Staphylococcus aureus: NEGATIVE

## 2020-08-12 ENCOUNTER — Other Ambulatory Visit (HOSPITAL_COMMUNITY)
Admission: RE | Admit: 2020-08-12 | Discharge: 2020-08-12 | Disposition: A | Discharge status: Home / Self Care | Payer: 59 | Source: Ambulatory Visit | Attending: Orthopaedic Surgery | Admitting: Orthopaedic Surgery

## 2020-08-12 DIAGNOSIS — Z01812 Encounter for preprocedural laboratory examination: Secondary | ICD-10-CM | POA: Insufficient documentation

## 2020-08-12 DIAGNOSIS — Z20822 Contact with and (suspected) exposure to covid-19: Secondary | ICD-10-CM | POA: Diagnosis not present

## 2020-08-12 LAB — SARS CORONAVIRUS 2 (TAT 6-24 HRS): SARS Coronavirus 2: NEGATIVE

## 2020-08-14 ENCOUNTER — Other Ambulatory Visit: Payer: Self-pay | Admitting: Orthopaedic Surgery

## 2020-08-14 MED ORDER — ASPIRIN 81 MG PO CHEW: 81.0000 mg | CHEWABLE_TABLET | Freq: Two times a day (BID) | ORAL | 0 refills | Status: DC

## 2020-08-14 MED ORDER — GABAPENTIN 100 MG PO CAPS: 100.0000 mg | ORAL_CAPSULE | Freq: Three times a day (TID) | ORAL | 0 refills | Status: DC | PRN

## 2020-08-14 MED ORDER — METHOCARBAMOL 500 MG PO TABS: 500.0000 mg | ORAL_TABLET | Freq: Four times a day (QID) | ORAL | 1 refills | Status: DC | PRN

## 2020-08-14 MED ORDER — OXYCODONE HCL 5 MG PO TABS: 5.0000 mg | ORAL_TABLET | ORAL | 0 refills | Status: DC | PRN

## 2020-08-14 NOTE — Anesthesia Preprocedure Evaluation (Addendum)
Anesthesia Evaluation  Patient identified by MRN, date of birth, ID band Patient awake    Reviewed: Allergy & Precautions, NPO status , Patient's Chart, lab work & pertinent test results  History of Anesthesia Complications Negative for: history of anesthetic complications  Airway Mallampati: II  TM Distance: >3 FB Neck ROM: Full    Dental no notable dental hx. (+) Dental Advisory Given   Pulmonary neg pulmonary ROS,    Pulmonary exam normal        Cardiovascular negative cardio ROS Normal cardiovascular exam     Neuro/Psych negative neurological ROS     GI/Hepatic Neg liver ROS, GERD  ,  Endo/Other  negative endocrine ROS  Renal/GU negative Renal ROS     Musculoskeletal  (+) Arthritis ,   Abdominal   Peds  Hematology negative hematology ROS (+)   Anesthesia Other Findings   Reproductive/Obstetrics                            Anesthesia Physical Anesthesia Plan  ASA: II  Anesthesia Plan: Spinal   Post-op Pain Management:    Induction:   PONV Risk Score and Plan: Ondansetron and Propofol infusion  Airway Management Planned: Natural Airway  Additional Equipment:   Intra-op Plan:   Post-operative Plan:   Informed Consent: I have reviewed the patients History and Physical, chart, labs and discussed the procedure including the risks, benefits and alternatives for the proposed anesthesia with the patient or authorized representative who has indicated his/her understanding and acceptance.     Dental advisory given  Plan Discussed with: Anesthesiologist and CRNA  Anesthesia Plan Comments:        Anesthesia Quick Evaluation

## 2020-08-14 NOTE — H&P (Signed)
TOTAL HIP ADMISSION H&P  Patient is admitted for left total hip arthroplasty.  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  Onset of symptoms was gradual starting 3 years ago with gradually worsening course since that time.The patient noted prior procedures of the hip to include arthroscopy on the left hip(s).  Patient currently rates pain in the left hip at 10 out of 10 with activity. Patient has night pain, worsening of pain with activity and weight bearing, pain that interfers with activities of daily living and pain with passive range of motion. Patient has evidence of subchondral sclerosis, periarticular osteophytes and joint space narrowing by imaging studies. This condition presents safety issues increasing the risk of falls.  There is no current active infection.  Patient Active Problem List   Diagnosis Date Noted  . Unilateral primary osteoarthritis, left hip 07/21/2020  . PCP NOTES >>>>>>>>>>>> 08/29/2016  . Annual physical exam 08/27/2016  . Adhesive capsulitis of right shoulder 06/06/2015  . Sinusitis, chronic 08/26/2013  . Labyrinthitis 08/26/2013  . Dysfunction of both eustachian tubes 08/26/2013  . Allergic rhinitis 08/26/2013  . GERD 10/17/2009  . RECTAL BLEEDING 10/17/2009  . REFLUX ESOPHAGITIS, HX OF 10/17/2009   Past Medical History:  Diagnosis Date  . Adhesive capsulitis of right shoulder 06/06/2015  . Allergic rhinitis   . GERD (gastroesophageal reflux disease)   . Internal hemorrhoid    by colonoscopy 2011    Past Surgical History:  Procedure Laterality Date  . EYE SURGERY     x 2  . HIP  SURGERY Left    labrum repair , arthroscopy  . SHOULDER ARTHROSCOPY Right 06/06/2015   Procedure: RIGHT SHOULDER ARTHROSCOPY WITH EXTENSIVE LABRAL DEBRIDEMENT AND MANIPULATION;  Surgeon: Marchia Bond, MD;  Location: [facility];  Service: Orthopedics;  Laterality: Right;    No current facility-administered medications for this encounter.   Current Outpatient Medications  Medication Sig Dispense Refill Last Dose  . famotidine (PEPCID) 20 MG tablet Take 20 mg by mouth daily as needed for heartburn or indigestion.     Marland Kitchen loratadine (CLARITIN) 10 MG tablet Take 10 mg by mouth daily as needed for allergies.      Allergies  Allergen Reactions  . Flomax [Tamsulosin Hcl] Shortness Of Breath    Social History   Tobacco Use  . Smoking status: Never Smoker  . Smokeless tobacco: Former Systems developer  . Tobacco comment: chews   Substance Use Topics  . Alcohol use: Yes    Comment: occas    Family History  Problem Relation Age of Onset  . Cancer Paternal Aunt        colon  . Cancer Maternal Grandfather        colon  . Diabetes Father   . CAD Father   . Lung cancer Father        smoker  . Prostate cancer Neg Hx      Review of Systems  Musculoskeletal: Positive for gait problem.  All other systems reviewed and are negative.   Objective:  Physical Exam Vitals reviewed.  Constitutional:      Appearance: Normal appearance.  HENT:     Head: Normocephalic and atraumatic.  Eyes:  Extraocular Movements: Extraocular movements intact.     Pupils: Pupils are equal, round, and reactive to light.  Cardiovascular:     Rate and Rhythm: Normal rate and regular rhythm.     Pulses: Normal pulses.  Pulmonary:     Effort: Pulmonary effort is normal.     Breath sounds: Normal breath sounds.  Abdominal:     Palpations: Abdomen is soft.  Musculoskeletal:     Cervical back: Normal range of motion and neck supple.     Left hip: Tenderness and bony tenderness present. Decreased range  of motion. Decreased strength.  Neurological:     Mental Status: He is alert and oriented to person, place, and time.  Psychiatric:        Behavior: Behavior normal.     Vital signs in last 24 hours:    Labs:   Estimated body mass index is 28.18 kg/m as calculated from the following:   Height as of 08/11/20: 5\' 9"  (1.753 m).   Weight as of 08/11/20: 86.5 kg.   Imaging Review Plain radiographs demonstrate severe degenerative joint disease of the left hip(s). The bone quality appears to be excellent for age and reported activity level.      Assessment/Plan:  End stage arthritis, left hip(s)  The patient history, physical examination, clinical judgement of the provider and imaging studies are consistent with end stage degenerative joint disease of the left hip(s) and total hip arthroplasty is deemed medically necessary. The treatment options including medical management, injection therapy, arthroscopy and arthroplasty were discussed at length. The risks and benefits of total hip arthroplasty were presented and reviewed. The risks due to aseptic loosening, infection, stiffness, dislocation/subluxation,  thromboembolic complications and other imponderables were discussed.  The patient acknowledged the explanation, agreed to proceed with the plan and consent was signed. Patient is being admitted for inpatient treatment for surgery, pain control, PT, OT, prophylactic antibiotics, VTE prophylaxis, progressive ambulation and ADL's and discharge planning.The patient is planning to be discharged home with home health services    Patient's anticipated LOS is less than 2 midnights, meeting these requirements: - Younger than 57 - Lives within 1 hour of care - Has a competent adult at home to recover with post-op recover - NO history of  - Chronic pain requiring opiods  - Diabetes  - Coronary Artery Disease  - Heart failure  - Heart attack  - Stroke  - DVT/VTE  - Cardiac arrhythmia  -  Respiratory Failure/COPD  - Renal failure  - Anemia  - Advanced Liver disease

## 2020-08-15 ENCOUNTER — Ambulatory Visit (HOSPITAL_COMMUNITY): Payer: 59 | Admitting: Anesthesiology

## 2020-08-15 ENCOUNTER — Ambulatory Visit (HOSPITAL_COMMUNITY): Payer: 59

## 2020-08-15 ENCOUNTER — Encounter (HOSPITAL_COMMUNITY): Payer: Self-pay | Admitting: Orthopaedic Surgery

## 2020-08-15 ENCOUNTER — Encounter (HOSPITAL_COMMUNITY)
Admission: RE | Disposition: A | Discharge status: Home Health | Payer: Self-pay | Source: Home / Self Care | Attending: Orthopaedic Surgery

## 2020-08-15 ENCOUNTER — Ambulatory Visit (HOSPITAL_COMMUNITY)
Admission: RE | Admit: 2020-08-15 | Discharge: 2020-08-15 | Disposition: A | Discharge status: Home Health | Payer: 59 | Attending: Orthopaedic Surgery | Admitting: Orthopaedic Surgery

## 2020-08-15 DIAGNOSIS — Z96642 Presence of left artificial hip joint: Secondary | ICD-10-CM | POA: Diagnosis not present

## 2020-08-15 DIAGNOSIS — Z888 Allergy status to other drugs, medicaments and biological substances status: Secondary | ICD-10-CM | POA: Diagnosis not present

## 2020-08-15 DIAGNOSIS — M1612 Unilateral primary osteoarthritis, left hip: Secondary | ICD-10-CM | POA: Diagnosis not present

## 2020-08-15 DIAGNOSIS — Z471 Aftercare following joint replacement surgery: Secondary | ICD-10-CM | POA: Diagnosis not present

## 2020-08-15 DIAGNOSIS — J309 Allergic rhinitis, unspecified: Secondary | ICD-10-CM | POA: Diagnosis not present

## 2020-08-15 DIAGNOSIS — S82122A Displaced fracture of lateral condyle of left tibia, initial encounter for closed fracture: Secondary | ICD-10-CM

## 2020-08-15 HISTORY — PX: TOTAL HIP ARTHROPLASTY: SHX124

## 2020-08-15 LAB — TYPE AND SCREEN
ABO/RH(D): A POS
Antibody Screen: NEGATIVE

## 2020-08-15 LAB — BASIC METABOLIC PANEL
Anion gap: 9 (ref 5–15)
BUN: 18 mg/dL (ref 6–20)
CO2: 21 mmol/L — ABNORMAL LOW (ref 22–32)
Calcium: 9 mg/dL (ref 8.9–10.3)
Chloride: 106 mmol/L (ref 98–111)
Creatinine, Ser: 1.34 mg/dL — ABNORMAL HIGH (ref 0.61–1.24)
GFR, Estimated: >60 mL/min (ref >60)
Glucose, Bld: 274 mg/dL — ABNORMAL HIGH (ref 70–99)
Potassium: 3.5 mmol/L (ref 3.5–5.1)
Sodium: 136 mmol/L (ref 135–145)

## 2020-08-15 LAB — ABO/RH: ABO/RH(D): A POS

## 2020-08-15 SURGERY — ARTHROPLASTY, HIP, TOTAL, ANTERIOR APPROACH
Anesthesia: Spinal | Site: Hip | Laterality: Left

## 2020-08-15 MED ORDER — DEXAMETHASONE SODIUM PHOSPHATE 10 MG/ML IJ SOLN
INTRAMUSCULAR | Status: AC
  Filled 2020-08-15: qty 1

## 2020-08-15 MED ORDER — CEFAZOLIN SODIUM-DEXTROSE 1-4 GM/50ML-% IV SOLN
1.0000 g | Freq: Once | INTRAVENOUS | Status: AC
  Administered 2020-08-15: 1 g via INTRAVENOUS

## 2020-08-15 MED ORDER — OXYCODONE HCL 5 MG PO TABS: 10.0000 mg | ORAL_TABLET | ORAL | Status: DC | PRN

## 2020-08-15 MED ORDER — PHENYLEPHRINE HCL (PRESSORS) 10 MG/ML IV SOLN
INTRAVENOUS | Status: DC | PRN
  Administered 2020-08-15: 40 ug via INTRAVENOUS
  Administered 2020-08-15 (×2): 80 ug via INTRAVENOUS

## 2020-08-15 MED ORDER — STERILE WATER FOR IRRIGATION IR SOLN
Status: DC | PRN
  Administered 2020-08-15: 2000 mL

## 2020-08-15 MED ORDER — ACETAMINOPHEN 500 MG PO TABS
1000.0000 mg | ORAL_TABLET | Freq: Once | ORAL | Status: AC
  Administered 2020-08-15: 1000 mg via ORAL
  Filled 2020-08-15: qty 2

## 2020-08-15 MED ORDER — BUPIVACAINE-EPINEPHRINE (PF) 0.25% -1:200000 IJ SOLN
INTRAMUSCULAR | Status: DC | PRN
  Administered 2020-08-15: 30 mL

## 2020-08-15 MED ORDER — CEFAZOLIN SODIUM-DEXTROSE 2-4 GM/100ML-% IV SOLN
2.0000 g | INTRAVENOUS | Status: DC
  Filled 2020-08-15: qty 100

## 2020-08-15 MED ORDER — MEPIVACAINE HCL (PF) 2 % IJ SOLN
INTRAMUSCULAR | Status: DC | PRN
  Administered 2020-08-15: 60 mg via INTRATHECAL

## 2020-08-15 MED ORDER — LACTATED RINGERS IV BOLUS
500.0000 mL | Freq: Once | INTRAVENOUS | Status: AC
  Administered 2020-08-15: 500 mL via INTRAVENOUS

## 2020-08-15 MED ORDER — PHENYLEPHRINE HCL-NACL 10-0.9 MG/250ML-% IV SOLN
INTRAVENOUS | Status: DC | PRN
  Administered 2020-08-15: 20 ug/min via INTRAVENOUS

## 2020-08-15 MED ORDER — PHENYLEPHRINE HCL (PRESSORS) 10 MG/ML IV SOLN
INTRAVENOUS | Status: AC
  Filled 2020-08-15: qty 1

## 2020-08-15 MED ORDER — MIDAZOLAM HCL 2 MG/2ML IJ SOLN
INTRAMUSCULAR | Status: DC | PRN
  Administered 2020-08-15 (×2): 1 mg via INTRAVENOUS

## 2020-08-15 MED ORDER — SODIUM CHLORIDE 0.9 % IR SOLN
Status: DC | PRN
  Administered 2020-08-15: 1000 mL

## 2020-08-15 MED ORDER — OXYCODONE HCL 5 MG PO TABS
ORAL_TABLET | ORAL | Status: AC
  Filled 2020-08-15: qty 1

## 2020-08-15 MED ORDER — METHOCARBAMOL 500 MG PO TABS: 500.0000 mg | ORAL_TABLET | Freq: Four times a day (QID) | ORAL | Status: DC | PRN

## 2020-08-15 MED ORDER — FENTANYL CITRATE (PF) 100 MCG/2ML IJ SOLN
25.0000 ug | INTRAMUSCULAR | Status: DC | PRN
Start: 2020-08-15 — End: 2020-08-15

## 2020-08-15 MED ORDER — BUPIVACAINE-EPINEPHRINE (PF) 0.25% -1:200000 IJ SOLN
INTRAMUSCULAR | Status: AC
  Filled 2020-08-15: qty 30

## 2020-08-15 MED ORDER — PROPOFOL 1000 MG/100ML IV EMUL
INTRAVENOUS | Status: AC
  Filled 2020-08-15: qty 100

## 2020-08-15 MED ORDER — TRANEXAMIC ACID-NACL 1000-0.7 MG/100ML-% IV SOLN
1000.0000 mg | INTRAVENOUS | Status: AC
  Administered 2020-08-15: 1000 mg via INTRAVENOUS
  Filled 2020-08-15: qty 100

## 2020-08-15 MED ORDER — 0.9 % SODIUM CHLORIDE (POUR BTL) OPTIME
TOPICAL | Status: DC | PRN
  Administered 2020-08-15: 1000 mL

## 2020-08-15 MED ORDER — ONDANSETRON HCL 4 MG/2ML IJ SOLN
INTRAMUSCULAR | Status: DC | PRN
  Administered 2020-08-15: 4 mg via INTRAVENOUS

## 2020-08-15 MED ORDER — LACTATED RINGERS IV BOLUS
250.0000 mL | Freq: Once | INTRAVENOUS | Status: AC
  Administered 2020-08-15: 250 mL via INTRAVENOUS

## 2020-08-15 MED ORDER — ACETAMINOPHEN 325 MG PO TABS: 325.0000 mg | ORAL_TABLET | Freq: Four times a day (QID) | ORAL | Status: DC | PRN

## 2020-08-15 MED ORDER — METHOCARBAMOL 500 MG IVPB - SIMPLE MED
500.0000 mg | Freq: Four times a day (QID) | INTRAVENOUS | Status: DC | PRN
  Administered 2020-08-15: 500 mg via INTRAVENOUS

## 2020-08-15 MED ORDER — AMISULPRIDE (ANTIEMETIC) 5 MG/2ML IV SOLN: 10.0000 mg | Freq: Once | INTRAVENOUS | Status: DC | PRN

## 2020-08-15 MED ORDER — METHOCARBAMOL 500 MG IVPB - SIMPLE MED
INTRAVENOUS | Status: AC
  Filled 2020-08-15: qty 50

## 2020-08-15 MED ORDER — ORAL CARE MOUTH RINSE: 15.0000 mL | Freq: Once | OROMUCOSAL | Status: AC

## 2020-08-15 MED ORDER — PROPOFOL 500 MG/50ML IV EMUL
INTRAVENOUS | Status: DC | PRN
  Administered 2020-08-15: 125 ug/kg/min via INTRAVENOUS

## 2020-08-15 MED ORDER — CEFAZOLIN SODIUM-DEXTROSE 1-4 GM/50ML-% IV SOLN
INTRAVENOUS | Status: AC
  Filled 2020-08-15: qty 50

## 2020-08-15 MED ORDER — CEFAZOLIN SODIUM-DEXTROSE 2-3 GM-%(50ML) IV SOLR
INTRAVENOUS | Status: DC | PRN
  Administered 2020-08-15: 2 g via INTRAVENOUS

## 2020-08-15 MED ORDER — POVIDONE-IODINE 10 % EX SWAB
2.0000 "application " | Freq: Once | CUTANEOUS | Status: AC
  Administered 2020-08-15: 2 via TOPICAL

## 2020-08-15 MED ORDER — FENTANYL CITRATE (PF) 100 MCG/2ML IJ SOLN
INTRAMUSCULAR | Status: AC
  Filled 2020-08-15: qty 2

## 2020-08-15 MED ORDER — FENTANYL CITRATE (PF) 100 MCG/2ML IJ SOLN
INTRAMUSCULAR | Status: DC | PRN
  Administered 2020-08-15 (×2): 50 ug via INTRAVENOUS

## 2020-08-15 MED ORDER — OXYCODONE HCL 5 MG PO TABS
5.0000 mg | ORAL_TABLET | ORAL | Status: DC | PRN
  Administered 2020-08-15 (×2): 5 mg via ORAL

## 2020-08-15 MED ORDER — MIDAZOLAM HCL 2 MG/2ML IJ SOLN
INTRAMUSCULAR | Status: AC
  Filled 2020-08-15: qty 2

## 2020-08-15 MED ORDER — CHLORHEXIDINE GLUCONATE 0.12 % MT SOLN
15.0000 mL | Freq: Once | OROMUCOSAL | Status: AC
  Administered 2020-08-15: 15 mL via OROMUCOSAL

## 2020-08-15 MED ORDER — DEXAMETHASONE SODIUM PHOSPHATE 10 MG/ML IJ SOLN
INTRAMUSCULAR | Status: DC | PRN
  Administered 2020-08-15: 4 mg via INTRAVENOUS

## 2020-08-15 MED ORDER — PROPOFOL 10 MG/ML IV BOLUS
INTRAVENOUS | Status: DC | PRN
  Administered 2020-08-15: 30 mg via INTRAVENOUS

## 2020-08-15 MED ORDER — LACTATED RINGERS IV SOLN: INTRAVENOUS | Status: DC

## 2020-08-15 MED FILL — ASPIRIN CHILD 81 MG TAB CHE: 81 | 15 days supply | Qty: 30 | Fill #0

## 2020-08-15 MED FILL — oxyCODONE HCL 5 MG TABS: 5 | 4 days supply | Qty: 30 | Fill #0

## 2020-08-15 MED FILL — METHOCARBAMOL 500 MG TABS: 500 | 10 days supply | Qty: 40 | Fill #0

## 2020-08-15 MED FILL — GABAPENTIN 100 MG CAPSULE: 100 | 20 days supply | Qty: 60 | Fill #0

## 2020-08-15 SURGICAL SUPPLY — 44 items
ACETAB CUP W/GRIPTION 54 (Plate) ×2 IMPLANT
APL SKNCLS STERI-STRIP NONHPOA (GAUZE/BANDAGES/DRESSINGS) ×1
BAG SPEC THK2 15X12 ZIP CLS (MISCELLANEOUS)
BAG ZIPLOCK 12X15 (MISCELLANEOUS) IMPLANT
BENZOIN TINCTURE PRP APPL 2/3 (GAUZE/BANDAGES/DRESSINGS) ×2 IMPLANT
BLADE SAW SGTL 18X1.27X75 (BLADE) ×2 IMPLANT
COLLAR OFFSET CORAIL SZ 12 HIP (Stem) ×1 IMPLANT
CORAIL OFFSET COLLAR SZ 12 HIP (Stem) ×2 IMPLANT
COVER PERINEAL POST (MISCELLANEOUS) ×2 IMPLANT
COVER SURGICAL LIGHT HANDLE (MISCELLANEOUS) ×2 IMPLANT
COVER WAND RF STERILE (DRAPES) ×2 IMPLANT
CUP ACETAB W/GRIPTION 54 (Plate) ×1 IMPLANT
DRAPE STERI IOBAN 125X83 (DRAPES) ×2 IMPLANT
DRAPE U-SHAPE 47X51 STRL (DRAPES) ×4 IMPLANT
DRESSING AQUACEL AG SP 3.5X10 (GAUZE/BANDAGES/DRESSINGS) IMPLANT
DRSG AQUACEL AG ADV 3.5X10 (GAUZE/BANDAGES/DRESSINGS) ×2 IMPLANT
DRSG AQUACEL AG SP 3.5X10 (GAUZE/BANDAGES/DRESSINGS) ×2
DURAPREP 26ML APPLICATOR (WOUND CARE) ×2 IMPLANT
ELECT REM PT RETURN 15FT ADLT (MISCELLANEOUS) ×2 IMPLANT
GAUZE XEROFORM 1X8 LF (GAUZE/BANDAGES/DRESSINGS) ×2 IMPLANT
GLOVE SRG 8 PF TXTR STRL LF DI (GLOVE) ×2 IMPLANT
GLOVE SURG ENC MOIS LTX SZ7.5 (GLOVE) ×2 IMPLANT
GLOVE SURG LTX SZ8 (GLOVE) ×2 IMPLANT
GLOVE SURG UNDER POLY LF SZ8 (GLOVE) ×4
GOWN STRL REUS W/TWL XL LVL3 (GOWN DISPOSABLE) ×4 IMPLANT
HANDPIECE INTERPULSE COAX TIP (DISPOSABLE) ×2
HEAD CERAMIC 36 PLUS 8.5 12 14 (Hips) ×2 IMPLANT
HOLDER FOLEY CATH W/STRAP (MISCELLANEOUS) ×2 IMPLANT
KIT TURNOVER KIT A (KITS) ×2 IMPLANT
LINER NEUTRAL 54X36MM PLUS 4 (Hips) ×2 IMPLANT
PACK ANTERIOR HIP CUSTOM (KITS) ×2 IMPLANT
PENCIL SMOKE EVACUATOR (MISCELLANEOUS) IMPLANT
SCREW 6.5MMX25MM (Screw) ×2 IMPLANT
SET HNDPC FAN SPRY TIP SCT (DISPOSABLE) ×1 IMPLANT
STAPLER VISISTAT 35W (STAPLE) IMPLANT
STRIP CLOSURE SKIN 1/2X4 (GAUZE/BANDAGES/DRESSINGS) ×2 IMPLANT
SUT ETHIBOND NAB CT1 #1 30IN (SUTURE) ×2 IMPLANT
SUT ETHILON 2 0 PS N (SUTURE) IMPLANT
SUT MNCRL AB 4-0 PS2 18 (SUTURE) IMPLANT
SUT VIC AB 0 CT1 36 (SUTURE) ×2 IMPLANT
SUT VIC AB 1 CT1 36 (SUTURE) ×2 IMPLANT
SUT VIC AB 2-0 CT1 27 (SUTURE) ×4
SUT VIC AB 2-0 CT1 TAPERPNT 27 (SUTURE) ×2 IMPLANT
TRAY FOLEY MTR SLVR 16FR STAT (SET/KITS/TRAYS/PACK) IMPLANT

## 2020-08-15 NOTE — Anesthesia Procedure Notes (Signed)
Spinal  Patient location during procedure: OR Start time: 08/15/2020 7:14 AM End time: 08/15/2020 7:21 AM Staffing Performed: anesthesiologist  Anesthesiologist: Duane Boston, MD Preanesthetic Checklist Completed: patient identified, IV checked, risks and benefits discussed, surgical consent, monitors and equipment checked, pre-op evaluation and timeout performed Spinal Block Patient position: sitting Prep: DuraPrep Patient monitoring: cardiac monitor, continuous pulse ox and blood pressure Approach: midline Location: L2-3 Injection technique: single-shot Needle Needle type: Pencan  Needle gauge: 24 G Needle length: 9 cm Additional Notes Functioning IV was confirmed and monitors were applied. Sterile prep and drape, including hand hygiene and sterile gloves were used. The patient was positioned and the spine was prepped. The skin was anesthetized with lidocaine.  Free flow of clear CSF was obtained prior to injecting local anesthetic into the CSF.  The spinal needle aspirated freely following injection.  The needle was carefully withdrawn.  The patient tolerated the procedure well.

## 2020-08-15 NOTE — Evaluation (Signed)
Physical Therapy Evaluation Patient Details Name: Terry LANDIN, MD MRN: [id number] DOB: 1960/09/30 Today's Date: 08/15/2020   History of Present Illness  Pt s/p L THR  Clinical Impression  Pt s/p L THR and presents with decreased L LE strength/ROM and post op pain limiting functional mobility.  Pt currently mobilizing at supervision/min guard level including stairs and eager for dc home this date.    Follow Up Recommendations Home health PT    Equipment Recommendations  Rolling walker with 5" wheels    Recommendations for Other Services       Precautions / Restrictions Precautions Precautions: Fall Restrictions Weight Bearing Restrictions: No Other Position/Activity Restrictions: WBAT      Mobility  Bed Mobility Overal bed mobility: Needs Assistance Bed Mobility: Supine to Sit     Supine to sit: Supervision     General bed mobility comments: no physical assist to move to EOB    Transfers Overall transfer level: Needs assistance Equipment used: Rolling walker (2 wheeled) Transfers: Sit to/from Stand Sit to Stand: Min guard;Supervision         General transfer comment: cues for LE management and use of UEs to self assist  Ambulation/Gait Ambulation/Gait assistance: Min guard;Supervision Gait Distance (Feet): 140 Feet Assistive device: Rolling walker (2 wheeled) Gait Pattern/deviations: Step-to pattern;Step-through pattern;Decreased step length - right;Decreased step length - left;Shuffle     General Gait Details: cues for posture and position from [employer]  Stairs Stairs: Yes Stairs assistance: Min guard Stair Management: One rail Right;Two rails;Step to pattern;Forwards;With cane Number of Stairs: 4 General stair comments: 2 steps with bil rails and 2 steps with rail and cane; cues for sequence and cane placement  Wheelchair Mobility    Modified Rankin (Stroke Patients Only)       Balance Overall balance assessment: Mild deficits observed, not  formally tested                                           Pertinent Vitals/Pain Pain Assessment: 0-10 Pain Score: 6  Pain Location: back pain Pain Descriptors / Indicators: Aching;Sore Pain Intervention(s): Limited activity within patient's tolerance;Monitored during session;Premedicated before session;Ice applied    Home Living Family/patient expects to be discharged to:: Private residence Living Arrangements: Spouse/significant other;Children Available Help at Discharge: Family Type of Home: House Home Access: Stairs to enter Entrance Stairs-Rails: Right;Left;Can reach both Technical brewer of Steps: 3 Home Layout: Two level Home Equipment: [place] - single point;Crutches      Prior Function Level of Independence: Independent               Hand Dominance        Extremity/Trunk Assessment   Upper Extremity Assessment Upper Extremity Assessment: Overall WFL for tasks assessed    Lower Extremity Assessment Lower Extremity Assessment: LLE deficits/detail    Cervical / Trunk Assessment Cervical / Trunk Assessment: Normal  Communication   Communication: No difficulties  Cognition Arousal/Alertness: Awake/alert Behavior During Therapy: WFL for tasks assessed/performed Overall Cognitive Status: Within Functional Limits for tasks assessed                                        General Comments      Exercises Total Joint Exercises Ankle Circles/Pumps: AROM;Both;15 reps;Supine   Assessment/Plan    PT  Assessment Patient needs continued PT services  PT Problem List Decreased strength;Decreased range of motion;Decreased activity tolerance;Decreased balance;Decreased mobility;Decreased knowledge of use of DME;Pain       PT Treatment Interventions DME instruction;Gait training;Stair training;Functional mobility training;Therapeutic activities;Therapeutic exercise;Patient/family education    PT Goals (Current goals can be  found in the Care Plan section)  Acute Rehab PT Goals Patient Stated Goal: Regain IND PT Goal Formulation: All assessment and education complete, DC therapy    Frequency Min 1X/week   Barriers to discharge        Co-evaluation               AM-PAC PT "6 Clicks" Mobility  Outcome Measure Help needed turning from your back to your side while in a flat bed without using bedrails?: A Little Help needed moving from lying on your back to sitting on the side of a flat bed without using bedrails?: A Little Help needed moving to and from a bed to a chair (including a wheelchair)?: A Little Help needed standing up from a chair using your arms (e.g., wheelchair or bedside chair)?: A Little Help needed to walk in hospital room?: A Little Help needed climbing 3-5 steps with a railing? : A Little 6 Click Score: 18    End of Session Equipment Utilized During Treatment: Gait belt Activity Tolerance: Patient tolerated treatment well Patient left: in chair;with call bell/phone within reach Nurse Communication: Mobility status PT Visit Diagnosis: Difficulty in walking, not elsewhere classified (R26.2)    Time: 9166-0600 PT Time Calculation (min) (ACUTE ONLY): 28 min   Charges:   PT Evaluation $PT Eval Low Complexity: 1 Low PT Treatments $Gait Training: 8-22 mins        [place] Pager [phone] Office [phone]   BRADSHAW,HUNTER 08/15/2020, 2:28 PM

## 2020-08-15 NOTE — Brief Op Note (Signed)
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   Delay start of Pharmacological VTE agent (>24hrs) due to surgical blood loss or risk of bleeding: no

## 2020-08-15 NOTE — Discharge Instructions (Signed)

## 2020-08-15 NOTE — Op Note (Signed)
NAMERA, PFIESTER MEDICAL RECORD NO: [id number] ACCOUNT NO: [id number] DATE OF BIRTH: 03/13/1961 FACILITY: [facility] LOCATION: [facility] PHYSICIAN: Lind Guest. Ninfa Linden, MD  Operative Report   DATE OF PROCEDURE: 08/15/2020  PREOPERATIVE DIAGNOSIS:  Primary osteoarthritis and degenerative joint disease, left hip.  POSTOPERATIVE DIAGNOSIS:  Primary osteoarthritis and degenerative joint disease, left hip.  PROCEDURE:  Left total hip arthroplasty through direct anterior approach.  IMPLANTS:  DePuy sector Gription acetabular component size 54 with a single screw, size 36+4 neutral polyethylene liner, size 12 Corail femoral component with high offset, size 36+8.5 ceramic head ball.  SURGEON:  Lind Guest. Ninfa Linden, MD  ASSISTANT:  Benita Stabile, PA-C  ANESTHESIA: 1.  Spinal. 2.  Local with 0.25% Marcaine mixed with epinephrine.  ANTIBIOTICS:  2 g IV Ancef.  ESTIMATED BLOOD LOSS:  200-250 mL.  COMPLICATIONS:  None.  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  He is a 60 year old gentleman with debilitating arthritis involving his left hip that has gotten worse over the years.  He actually has a remote history of a left hip arthroscopy.  He did have  a little bit of neuropathy after that and he felt like a short set of RSD.  Over the years, he has developed shortening of that left hip with worsening pain.  He walks with a slight Trendelenburg gait.  At this point, his left hip pain is detrimentally  affecting his mobility, his quality of life, and his activities of daily living to the point he does wish to proceed with a total hip arthroplasty.  We had a long and thorough discussion about the risk of acute blood loss anemia, nerve or vessel injury,  fracture, infection, dislocation, DVT, implant failure, and skin and soft tissue issues.  He understands our goals are to decrease pain, improve mobility, and overall improve quality of life.  DESCRIPTION OF PROCEDURE:  After  informed consent was obtained, the left hip was marked.  He was brought to the operating room and sat up on a stretcher.  Spinal anesthesia was obtained.  He was laid in the supine position on a stretcher.  I was really  able to get a good judge of his leg length and again he was shorter on his left side than the right, even though radiographically looks like he is on.  I placed traction boots on both his feet and he was placed supine on the Hana fracture table.  The  perineal post was in place and both legs in line skeletal traction devices and no traction applied.  His left operative hip was prepped and draped with DuraPrep and sterile drapes.  A timeout was called, and he was identified as the correct patient,  correct left hip.  I then made an incision just inferior and posterior to the anterior superior iliac spine and carried this slightly obliquely down the leg.  We dissected down tensor fascia lata muscle.  Tensor fascia was then divided longitudinally to  proceed with a direct anterior approach to the hip.  We identified and cauterized circumflex vessels, then identified the hip capsule, opened the hip capsule in an L-type format finding a very large joint effusion and significant periarticular  osteophytes around the lateral femoral head and neck.  I placed Cobra retractors within the joint capsule around the medial and lateral femoral neck.  I made our femoral neck cut with an oscillating saw just proximal to the lesser trochanter.  I  completed this with an osteotome.  I placed a corkscrew guide in the femoral head and removed the femoral head in its entirety and found a wide area devoid of cartilage.  I then placed a bent Hohmann over the medial acetabular rim and removed remnants of  the acetabular labrum and other debris and also removed periarticular osteophytes around the acetabulum.  I then began reaming under direct visualization from a size 43 reamer in stepwise increments going up to a  size 53 with all reamers were placed  under direct visualization and the last reamer was placed under direct fluoroscopy, so I could obtain my depth of reaming, my inclination and anteversion.  I then placed a real DePuy sector Gription acetabular component size 54 and I did place a single  screw.  I then went with a 36+4 polyethylene liner.  Attention was then turned to the femur.  With the leg externally rotated to 120 degrees, extended, and adducted, I was able to place a Mueller retractor medially and a Hohmann retractor behind the  greater trochanter.  I released the lateral joint capsule and used a box cutting osteotome to enter the femoral canal and a rongeur to lateralize.  Then, we began broaching using the Corail broaching system from a size 8 going up to size 12.  With size  12 in place, we trialed standard offset femoral neck and the 36+1.5 head ball.  We reduced this and acetabulum and right away we could tell we needed much more leg length and offset.  We dislocated the hip and removed the trial components.  We placed the  real Corail femoral component with high offset size 12 along with a 36+8.5 ceramic head ball, reduced this into the acetabulum.  We were pleased with leg length, offset, range of motion, and stability assessed mechanically and radiographically.  We then  irrigated the soft tissue with normal saline solution using pulsatile lavage.  We dried things very well and then closed the arthrotomy with interrupted #1 Ethibond suture.  #1 Vicryl was then used to close the tensor fascia, 0 Vicryl was used to close  the deep tissue, and 2-0 Vicryl was used to close the subcutaneous tissue.  4-0 Monocryl subcuticular stitch was applied and Steri-Strips were placed on the skin.  An Aquacel dressing was placed.  He was taken off the [facility] table.  In and out  catheterization was performed.  He was taken to recovery room in stable condition with all final counts being correct and no  complications noted.  Of note, Benita Stabile, PA-C, did assist during the entire case from the beginning to the end and his assistance was crucial for facilitating all aspects of this case.   [facility] [place] D: 08/15/2020 8:28:16 am T: 08/15/2020 11:01:00 am  JOB: [id number]/ [id number]

## 2020-08-15 NOTE — Progress Notes (Signed)
[place] office RN Case manager assisted with post-op instructions as well as DME/HHPT for patient prior to surgery due to anticipated discharge same day. Reviewed all post-op care instructions with patient prior to surgery. Patient has a wife that will be assisting at home after discharge. He will need a FWW, which was already ordered through [facility] to be delivered to the PACU prior to discharge. He reports having a 3in1/BSC that he will be borrowing from a neighbor. HHPT anticipated- Referral made to [facility] after choice provided. Jamse Arn, [place].

## 2020-08-15 NOTE — Interval H&P Note (Signed)
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  This is been well-documented.  There has been no interval change or acute change in his medical status.  See recent H&P.  The risks and benefits of surgery have been explained in detail and informed consent is obtained.  The left hip has been marked.  08/15/2020 6:59 AM  Terry Hector, MD  has presented today for surgery, with the diagnosis of Osteoarthritis Left Hip.  The various methods of treatment have been discussed with the patient and family. After consideration of risks, benefits and other options for treatment, the patient has consented to  Procedure(s): LEFT TOTAL HIP ARTHROPLASTY ANTERIOR APPROACH (Left) as a surgical intervention.  The patient's history has been reviewed, patient examined, no change in status, stable for surgery.  I have reviewed the patient's chart and labs.  Questions were answered to the patient's satisfaction.     [name]

## 2020-08-15 NOTE — Transfer of Care (Signed)
Immediate Anesthesia Transfer of Care Note  Patient: Terry Hector, MD  Procedure(s) Performed: LEFT TOTAL HIP ARTHROPLASTY ANTERIOR APPROACH (Left Hip)  Patient Location: PACU  Anesthesia Type:Spinal  Level of Consciousness: drowsy  Airway & Oxygen Therapy: Patient Spontanous Breathing and Patient connected to face mask oxygen  Post-op Assessment: Report given to RN and Post -op Vital signs reviewed and stable  Post vital signs: Reviewed and stable  Last Vitals:  Vitals Value Taken Time  BP    Temp    Pulse 48 08/15/20 0857  Resp 10 08/15/20 0857  SpO2 100 % 08/15/20 0857  Vitals shown include unvalidated device data.  Last Pain:  Vitals:   08/15/20 0613  TempSrc:   PainSc: 7       Patients Stated Pain Goal: 4 (30/73/54 3014)  Complications: No complications documented.

## 2020-08-15 NOTE — Anesthesia Postprocedure Evaluation (Signed)
Anesthesia Post Note  Patient: Terry Hector, MD  Procedure(s) Performed: LEFT TOTAL HIP ARTHROPLASTY ANTERIOR APPROACH (Left Hip)     Patient location during evaluation: PACU Anesthesia Type: Spinal Level of consciousness: awake and alert Pain management: pain level controlled Vital Signs Assessment: post-procedure vital signs reviewed and stable Respiratory status: spontaneous breathing and respiratory function stable Cardiovascular status: blood pressure returned to baseline and stable Postop Assessment: spinal receding Anesthetic complications: no   No complications documented.  Last Vitals:  Vitals:   08/15/20 1030 08/15/20 1045  BP: (!) 134/99 121/86  Pulse: 65 (!) 56  Resp: (!) 9 13  Temp:  (!) 36.4 C  SpO2: 98% 100%    Last Pain:  Vitals:   08/15/20 1057  TempSrc:   PainSc: 4                  SINGER,JAMES DANIEL

## 2020-08-17 DIAGNOSIS — Z471 Aftercare following joint replacement surgery: Secondary | ICD-10-CM | POA: Diagnosis not present

## 2020-08-17 DIAGNOSIS — Z87891 Personal history of nicotine dependence: Secondary | ICD-10-CM | POA: Diagnosis not present

## 2020-08-17 DIAGNOSIS — Z7982 Long term (current) use of aspirin: Secondary | ICD-10-CM | POA: Diagnosis not present

## 2020-08-17 DIAGNOSIS — J329 Chronic sinusitis, unspecified: Secondary | ICD-10-CM | POA: Diagnosis not present

## 2020-08-17 DIAGNOSIS — K2100 Gastro-esophageal reflux disease with esophagitis, without bleeding: Secondary | ICD-10-CM | POA: Diagnosis not present

## 2020-08-17 DIAGNOSIS — J309 Allergic rhinitis, unspecified: Secondary | ICD-10-CM | POA: Diagnosis not present

## 2020-08-17 DIAGNOSIS — Z96642 Presence of left artificial hip joint: Secondary | ICD-10-CM | POA: Diagnosis not present

## 2020-08-18 ENCOUNTER — Encounter (HOSPITAL_COMMUNITY): Payer: Self-pay | Admitting: Orthopaedic Surgery

## 2020-08-19 DIAGNOSIS — Z96642 Presence of left artificial hip joint: Secondary | ICD-10-CM | POA: Diagnosis not present

## 2020-08-19 DIAGNOSIS — Z87891 Personal history of nicotine dependence: Secondary | ICD-10-CM | POA: Diagnosis not present

## 2020-08-19 DIAGNOSIS — K2100 Gastro-esophageal reflux disease with esophagitis, without bleeding: Secondary | ICD-10-CM | POA: Diagnosis not present

## 2020-08-19 DIAGNOSIS — Z471 Aftercare following joint replacement surgery: Secondary | ICD-10-CM | POA: Diagnosis not present

## 2020-08-19 DIAGNOSIS — J329 Chronic sinusitis, unspecified: Secondary | ICD-10-CM | POA: Diagnosis not present

## 2020-08-19 DIAGNOSIS — J309 Allergic rhinitis, unspecified: Secondary | ICD-10-CM | POA: Diagnosis not present

## 2020-08-19 DIAGNOSIS — Z7982 Long term (current) use of aspirin: Secondary | ICD-10-CM | POA: Diagnosis not present

## 2020-08-21 ENCOUNTER — Ambulatory Visit (INDEPENDENT_AMBULATORY_CARE_PROVIDER_SITE_OTHER): Payer: 59 | Admitting: Orthopaedic Surgery

## 2020-08-21 ENCOUNTER — Encounter: Payer: Self-pay | Admitting: Orthopaedic Surgery

## 2020-08-21 DIAGNOSIS — Z96642 Presence of left artificial hip joint: Secondary | ICD-10-CM

## 2020-08-21 NOTE — Progress Notes (Signed)
Terry Benson is 6 days status post a left total hip arthroplasty.  I wanted to see him today since I am gone next week.  He is doing well overall.  He is work on range of motion and strength.  His wife is with him and said he is actively trying to much.  He walked a mile yesterday.  He is taking 1 oxycodone twice a day.  On examination he does have bruising around his left operative hip and the expected.  There is a hematoma but no seroma.  His leg lengths are equal.  His incision looks good.  I placed a new Aquasol dressing and some new Steri-Strips.  He will continue twice a day aspirin for at least another week.  He can alternate ice and heat.  He can transition to Advil when he is comfortable trying some Aleve 600 800 mg up to 3 times a day with food.  I would like to see him back in 3 weeks does have is doing overall but no x-rays are needed.

## 2020-08-22 DIAGNOSIS — Z7982 Long term (current) use of aspirin: Secondary | ICD-10-CM | POA: Diagnosis not present

## 2020-08-22 DIAGNOSIS — K2100 Gastro-esophageal reflux disease with esophagitis, without bleeding: Secondary | ICD-10-CM | POA: Diagnosis not present

## 2020-08-22 DIAGNOSIS — Z96642 Presence of left artificial hip joint: Secondary | ICD-10-CM | POA: Diagnosis not present

## 2020-08-22 DIAGNOSIS — J309 Allergic rhinitis, unspecified: Secondary | ICD-10-CM | POA: Diagnosis not present

## 2020-08-22 DIAGNOSIS — Z87891 Personal history of nicotine dependence: Secondary | ICD-10-CM | POA: Diagnosis not present

## 2020-08-22 DIAGNOSIS — J329 Chronic sinusitis, unspecified: Secondary | ICD-10-CM | POA: Diagnosis not present

## 2020-08-22 DIAGNOSIS — Z471 Aftercare following joint replacement surgery: Secondary | ICD-10-CM | POA: Diagnosis not present

## 2020-09-02 ENCOUNTER — Other Ambulatory Visit: Payer: Self-pay | Admitting: Orthopaedic Surgery

## 2020-09-02 MED ORDER — METHOCARBAMOL 750 MG PO TABS: 750.0000 mg | ORAL_TABLET | Freq: Four times a day (QID) | ORAL | 1 refills | Status: DC | PRN

## 2020-09-02 MED FILL — METHOCARBAMOL 750 MG TABS: 750 | 15 days supply | Qty: 60 | Fill #0

## 2020-09-05 ENCOUNTER — Encounter: Payer: Self-pay | Admitting: Internal Medicine

## 2020-09-05 ENCOUNTER — Other Ambulatory Visit (HOSPITAL_COMMUNITY): Payer: Self-pay | Admitting: Emergency Medicine

## 2020-09-08 ENCOUNTER — Ambulatory Visit: Payer: 59 | Admitting: Orthopaedic Surgery

## 2020-09-12 ENCOUNTER — Other Ambulatory Visit (HOSPITAL_COMMUNITY): Payer: Self-pay | Admitting: Emergency Medicine

## 2020-09-15 ENCOUNTER — Other Ambulatory Visit: Payer: Self-pay

## 2020-09-15 ENCOUNTER — Ambulatory Visit (HOSPITAL_COMMUNITY)
Admission: RE | Admit: 2020-09-15 | Discharge: 2020-09-15 | Disposition: A | Discharge status: Home / Self Care | Payer: 59 | Source: Ambulatory Visit | Attending: Cardiology | Admitting: Cardiology

## 2020-09-17 ENCOUNTER — Encounter: Payer: Self-pay | Admitting: Orthopaedic Surgery

## 2020-09-17 ENCOUNTER — Ambulatory Visit (INDEPENDENT_AMBULATORY_CARE_PROVIDER_SITE_OTHER): Payer: 59 | Admitting: Orthopaedic Surgery

## 2020-09-17 DIAGNOSIS — Z96642 Presence of left artificial hip joint: Secondary | ICD-10-CM

## 2020-09-17 NOTE — Progress Notes (Signed)
Olive Bass is getting close to 5 weeks status post a left total hip arthroplasty.  He is doing well other than stiffness around the hip itself from an open hematoma.  He still taking some Neurontin and Aleve and using a heating pad.  He is walking without a limp.  His leg lengths are equal.  He tolerates me easily putting his left operative hip through internal and external rotation.  His incision is healed nicely.  There is firmness to be expected from the surgery and he does have a very strong and vibrant tensor fascia lata muscle and I think that that is what he is dealing with in terms of just how big that muscle was.  This should resolve with time.  I gave him reassurance about that.  We will see him back in 6 months unless there is any issues.  He knows to reach out to me if there is.  At that visit I like a standing low AP pelvis and lateral of his left operative hip.

## 2020-10-07 ENCOUNTER — Encounter: Payer: Self-pay | Admitting: Internal Medicine

## 2020-10-15 DIAGNOSIS — Z1231 Encounter for screening mammogram for malignant neoplasm of breast: Secondary | ICD-10-CM | POA: Diagnosis not present

## 2020-11-05 ENCOUNTER — Ambulatory Visit: Payer: Self-pay

## 2020-11-05 ENCOUNTER — Other Ambulatory Visit: Payer: Self-pay

## 2020-11-05 ENCOUNTER — Ambulatory Visit (INDEPENDENT_AMBULATORY_CARE_PROVIDER_SITE_OTHER): Payer: 59 | Admitting: Family Medicine

## 2020-11-05 ENCOUNTER — Encounter: Payer: 59 | Admitting: Internal Medicine

## 2020-11-05 DIAGNOSIS — M1612 Unilateral primary osteoarthritis, left hip: Secondary | ICD-10-CM | POA: Diagnosis not present

## 2020-11-05 DIAGNOSIS — Z96642 Presence of left artificial hip joint: Secondary | ICD-10-CM

## 2020-11-05 NOTE — Progress Notes (Signed)
I saw and examined the patient with Dr. Elouise Munroe and agree with assessment and plan as outlined.    Left hip seroma aspirated (50 cc) today.  Will return in a couple weeks if recurs.

## 2020-11-05 NOTE — Progress Notes (Signed)
Office Visit Note   Patient: Josue Hector, MD           Date of Birth: 1960-12-16           MRN: [id number] Visit Date: 11/05/2020 Requested by: Colon Branch, [address] [place],  [place] [address] PCP: Colon Branch, MD  Subjective: Chief Complaint  Patient presents with  . Left Hip - Pain    HPI: 60yo M presenting to clinic with concerns of ongoing left anterior thigh pain since his hip replacement in early March (nearly 7mo ago). Patient states that his joint itself feels fine, but the area around the joint is very hard, and he has pain with flexion of the hip. Previously, he was told there was a hematoma in the area and just to give it time. He is now concerned that there may be calcifications within the scar tissue. He says he is otherwise doing very well.          Objective: Vital Signs: There were no vitals taken for this visit.  Physical Exam:  General:  Alert and oriented, in no acute distress. Pulm:  Breathing unlabored. Psy:  Normal mood, congruent affect. Skin:  Left thigh Linear incision in anterior thigh appears well healed, with no surrounding erythema. Skin overwise intact.   Normal gait. Left anterior thigh feels indurated when compared to right.   Imaging: Limited Extremity US: Left Hip/Anterior Thigh Large fluid collection within the anterior thigh, approximately 4cm in diameter and extending approximately 6cm in length. Appears minimally loculated, and homogenous.   Impression: Fluid collection consistent with large seroma in anterior hip flexor musculature.   Assessment & Plan: 60yo M presenting to clinic with residual anterior hip/thigh pain following joint replacement 64mo ago. Ultrasound reveals a large fluid collection, consistent with a post-operative seroma, likely causing a mass effect and limiting his hip flexion.  - Risks and benefits of drainage discussed, and patient opted to proceed.  - Aspiration performed as described below,  which patient tolerated very well.  - Aftercare and return precautions discussed, given high rate of reaccumulation. He will schedule another appointment in 2 weeks to reevaluate.  - Patient had no further questions or concerns today.      Procedures: Left Anterior Thigh Injection:  Risks and benefits of procedure discussed, Patient opted to proceed. Verbal Consent obtained.  Timeout performed.  Skin prepped in a sterile fashion with betadine before further cleansing with alcohol. Ethyl Chloride was used for topical analgesia.  Superficial wheal was created overlying the indurated lesion. Seroma was injected with 4cc 1% Lidocaine without epinephrine under US guidance using a 25G, 1.5in needle. Following anesthetic application, approximately 50cc of serosanguinous fluid was then aspirated from the area thorough 18G needle.   Patient tolerated the injection well with no immediate complications. Aftercare instructions were discussed, and patient was given strict return precautions.     PMFS History: Patient Active Problem List   Diagnosis Date Noted  . Unilateral primary osteoarthritis, left hip 07/21/2020  . PCP NOTES >>>>>>>>>>>> 08/29/2016  . Annual physical exam 08/27/2016  . Adhesive capsulitis of right shoulder 06/06/2015  . Sinusitis, chronic 08/26/2013  . Labyrinthitis 08/26/2013  . Dysfunction of both eustachian tubes 08/26/2013  . Allergic rhinitis 08/26/2013  . GERD 10/17/2009  . RECTAL BLEEDING 10/17/2009  . REFLUX ESOPHAGITIS, HX OF 10/17/2009   Past Medical History:  Diagnosis Date  . Adhesive capsulitis of right shoulder 06/06/2015  . Allergic rhinitis   .  GERD (gastroesophageal reflux disease)   . Internal hemorrhoid    by colonoscopy 2011    Family History  Problem Relation Age of Onset  . Cancer Paternal Aunt        colon  . Cancer Maternal Grandfather        colon  . Diabetes Father   . CAD Father   . Lung cancer Father        smoker  . Prostate  cancer Neg Hx     Past Surgical History:  Procedure Laterality Date  . EYE SURGERY     x 2  . HIP SURGERY Left    labrum repair , arthroscopy  . SHOULDER ARTHROSCOPY Right 06/06/2015   Procedure: RIGHT SHOULDER ARTHROSCOPY WITH EXTENSIVE LABRAL DEBRIDEMENT AND MANIPULATION;  Surgeon: Marchia Bond, MD;  Location: [facility];  Service: Orthopedics;  Laterality: Right;  . TOTAL HIP ARTHROPLASTY Left 08/15/2020   Procedure: LEFT TOTAL HIP ARTHROPLASTY ANTERIOR APPROACH;  Surgeon: [name], MD;  Location: [facility];  Service: Orthopedics;  Laterality: Left;   Social History   Occupational History  . Occupation: MD  Tobacco Use  . Smoking status: Never Smoker  . Smokeless tobacco: Former Systems developer  . Tobacco comment: chews   Vaping Use  . Vaping Use: Never used  Substance and Sexual Activity  . Alcohol use: Yes    Comment: occas  . Drug use: No  . Sexual activity: Yes    Partners: Female

## 2020-11-06 ENCOUNTER — Encounter: Payer: Self-pay | Admitting: Internal Medicine

## 2020-11-06 ENCOUNTER — Ambulatory Visit (INDEPENDENT_AMBULATORY_CARE_PROVIDER_SITE_OTHER): Payer: 59 | Admitting: Internal Medicine

## 2020-11-06 ENCOUNTER — Other Ambulatory Visit (HOSPITAL_COMMUNITY): Payer: Self-pay

## 2020-11-06 VITALS — BP 142/84 | HR 96 | Temp 98.2°F | Resp 16 | Ht 69.0 in | Wt 190.0 lb

## 2020-11-06 DIAGNOSIS — Z0000 Encounter for general adult medical examination without abnormal findings: Secondary | ICD-10-CM | POA: Diagnosis not present

## 2020-11-06 MED ORDER — ROSUVASTATIN CALCIUM 5 MG PO TABS
5.0000 mg | ORAL_TABLET | Freq: Every day | ORAL | 0 refills | Status: DC
  Filled 2020-11-06: qty 90, 90d supply, fill #0

## 2020-11-06 NOTE — Patient Instructions (Signed)
Check the  blood pressure regularly. BP GOAL is between 110/65 and  135/85. If it is consistently higher or lower, let me know  Get blood work at your office: CMP, FLP, A1c, TSH, free T4, PSA  Consider a fourth COVID-vaccine  GO TO THE FRONT DESK, [place] back for a physical exam in 1 year   "Living will", "[place] of attorney": Advanced care planning  (If you already have a living will or healthcare power of attorney, please bring the copy to be scanned in your chart.)  Advance care planning is a process that supports adults in  understanding and sharing their preferences regarding future medical care.   The patient's preferences are recorded in documents called Advance Directives.    Advanced directives are completed (and can be modified at any time) while the patient is in full mental capacity.   The documentation should be available at all times to the patient, the family and the healthcare providers.  Bring in a copy to be scanned in your chart is an excellent idea and is recommended   This legal documents direct treatment decision making and/or appoint a surrogate to make the decision if the patient is not capable to do so.    Advance directives can be documented in many types of formats,  documents have names such as:  Lliving will  Durable power of attorney for healthcare (healthcare proxy or healthcare power of attorney)  Combined directives  Physician orders for life-sustaining treatment    More information at:  [email or web address]

## 2020-11-06 NOTE — Assessment & Plan Note (Signed)
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  cologuard (-) 10/2019 --Prostate ca screening: DRE normal today, check a PSA. --Diet and exercise discussed --chews tobacco, counseled, ready to quit, recommend to see the dentist regularly -- labs:CMP, A1c, FLP, PSA, TSH, free T4;  to be done at his office. - POA: Discussed and recommended

## 2020-11-06 NOTE — Progress Notes (Signed)
Subjective:    Patient ID: Terry Hector, MD, male    DOB: 1961-02-06, 60 y.o.   MRN: [id number]  DOS:  11/06/2020 Type of visit - description: CPX  Few months ago had a hip replacement, L, recovery has been slow, he recently had a hematoma drained  from the surgical site. Other than that , he is trying to remain active and takes walks. Occasionally has urinary frequency but no other LUTS.   Review of Systems  Other than above, a 14 point review of systems is negative      Past Medical History:  Diagnosis Date  . Adhesive capsulitis of right shoulder 06/06/2015  . Allergic rhinitis   . GERD (gastroesophageal reflux disease)   . Internal hemorrhoid    by colonoscopy 2011    Past Surgical History:  Procedure Laterality Date  . EYE SURGERY     x 2  . HIP SURGERY Left    labrum repair , arthroscopy  . SHOULDER ARTHROSCOPY Right 06/06/2015   Procedure: RIGHT SHOULDER ARTHROSCOPY WITH EXTENSIVE LABRAL DEBRIDEMENT AND MANIPULATION;  Surgeon: Marchia Bond, MD;  Location: [facility];  Service: Orthopedics;  Laterality: Right;  . TOTAL HIP ARTHROPLASTY Left 08/15/2020   Procedure: LEFT TOTAL HIP ARTHROPLASTY ANTERIOR APPROACH;  Surgeon: [name], MD;  [facility];  Service: Orthopedics;  Laterality: Left;   Social History   Socioeconomic History  . Marital status: Married    Spouse name: Not on file  . Number of children: Not on file  . Years of education: Not on file  . Highest education level: Not on file  Occupational History  . Occupation: MD  Tobacco Use  . Smoking status: Never Smoker  . Smokeless tobacco: Former Systems developer  . Tobacco comment: chews   Vaping Use  . Vaping Use: Never used  Substance and Sexual Activity  . Alcohol use: Yes    Comment: occas  . Drug use: No  . Sexual activity: Yes    Partners: Female  Other Topics Concern  . 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   2  children    -son at [employer] state, Animal nutritionist school?   -Daughter in college, physical therapy?.    Rides motorcycle for leisure - aware of risks.   Social Determinants of Health   Financial Resource Strain: Not on file  Food Insecurity: Not on file  Transportation Needs: Not on file  Physical Activity: Not on file  Stress: Not on file  Social Connections: Not on file  Intimate Partner Violence: Not on file     Allergies as of 11/06/2020      Reactions   Flomax [tamsulosin Hcl] Shortness Of Breath      Medication List       Accurate as of Nov 06, 2020  8:55 PM. If you have any questions, ask your nurse or doctor.        loratadine 10 MG tablet Commonly known as: CLARITIN Take 10 mg by mouth daily as needed for allergies.   rosuvastatin 5 MG tablet Commonly known as: Crestor Take 1 tablet (5 mg total) by mouth daily. Started by: Kathlene November, MD          Objective:   Physical Exam BP (!) 142/84 (BP Location: Left Arm, Patient Position: Sitting, Cuff Size: Normal)   Pulse 96   Temp 98.2 F (36.8 C) (Oral)   Resp 16   Ht 5\' 9"  (  1.753 m)   Wt 190 lb (86.2 kg)   SpO2 98%   BMI 28.06 kg/m  General: Well developed, NAD, BMI noted Neck: No  thyromegaly  HEENT:  Normocephalic . Face symmetric, atraumatic Lungs:  CTA B Normal respiratory effort, no intercostal retractions, no accessory muscle use. Heart: RRR,  no murmur.  Abdomen:  Not distended, soft, non-tender. No rebound or rigidity.   Lower extremities: no pretibial edema bilaterally DRE: Normal sphincter, no stools, prostate normal in size, not nodular. Skin: Exposed areas without rash. Not pale. Not jaundice Neurologic:  alert & oriented X3.  Speech normal, gait appropriate for age, minimal limping from recent hip replacement Strength symmetric and appropriate for age.  Psych: Cognition and judgment appear intact.  Cooperative with normal attention span and concentration.  Behavior appropriate. No  anxious or depressed appearing.     Assessment     Assessment Mild DM:  A1c 6.5 ---2009 Dyslipidemia Allergies and RAD + FH CAD:has  a coronary calcium score every 3 years GERD: Multiple EGDs, 07/25/1997 esoph biopsy: Esophagitis, no Barrett's. LUTS MSK: Left hip pain on and off   PLAN: Here for CPX DM: Check A1c, doing well with lifestyle Dyslipidemia: We will check an FLP today, patient desires to start Crestor 5 mg, Rx sent, recheck labs 2 months after he  start medications. FH CAD: Last coronary calcium score 09/15/2020 showed 25 percentile for age. LUTS: Minimal symptoms at this point, did not tolerate Flomax.  Observation. DJD: Slow recovering from L hip replacement.  Nevertheless he remains active and takes walks. Slightly increased TSH: Check TFTs RTC 1 year.    This visit occurred during the SARS-CoV-2 public health emergency.  Safety protocols were in place, including screening questions prior to the visit, additional usage of staff PPE, and extensive cleaning of exam room while observing appropriate contact time as indicated for disinfecting solutions.

## 2020-11-06 NOTE — Assessment & Plan Note (Signed)
Here for CPX DM: Check A1c, doing well with lifestyle Dyslipidemia: We will check an FLP today, patient desires to start Crestor 5 mg, Rx sent, recheck labs 2 months after he  start medications. FH CAD: Last coronary calcium score 09/15/2020 showed 55 percentile for age. LUTS: Minimal symptoms at this point, did not tolerate Flomax.  Observation. DJD: Slow recovering from L hip replacement.  Nevertheless he remains active and takes walks. Slightly increased TSH: Check TFTs RTC 1 year.

## 2020-11-19 ENCOUNTER — Encounter: Payer: Self-pay | Admitting: Family Medicine

## 2020-11-19 ENCOUNTER — Encounter: Payer: Self-pay | Admitting: Orthopaedic Surgery

## 2020-11-21 ENCOUNTER — Ambulatory Visit: Payer: Self-pay

## 2020-11-21 ENCOUNTER — Ambulatory Visit (INDEPENDENT_AMBULATORY_CARE_PROVIDER_SITE_OTHER): Payer: 59 | Admitting: Family Medicine

## 2020-11-21 ENCOUNTER — Other Ambulatory Visit: Payer: Self-pay

## 2020-11-21 DIAGNOSIS — Z96642 Presence of left artificial hip joint: Secondary | ICD-10-CM | POA: Diagnosis not present

## 2020-11-21 DIAGNOSIS — M1612 Unilateral primary osteoarthritis, left hip: Secondary | ICD-10-CM | POA: Diagnosis not present

## 2020-11-21 NOTE — Progress Notes (Signed)
Office Visit Note   Patient: Terry Hector, MD           Date of Birth: 01-20-1961           MRN: [id number] Visit Date: 11/21/2020 Requested by: Colon Branch, [address] [place],  [place] [address] PCP: Colon Branch, MD  Subjective: Chief Complaint  Patient presents with   Left Hip - Pain, Follow-up    HPI: He is here with persistent left hip discomfort.  He continues to feel firmness in the anterior hip, which is especially bothersome during hip flexion.              ROS:   All other systems were reviewed and are negative.  Objective: Vital Signs: There were no vitals taken for this visit.  Physical Exam:  General:  Alert and oriented, in no acute distress. Pulm:  Breathing unlabored. Psy:  Normal mood, congruent affect. Skin: No erythema Left hip: Deep to his surgical scar there is an approximately 5 to 6 cm in length area of firmness.  He has no pain with passive internal/external hip rotation.  There is pain with active hip flexion but his strength is good.  Imaging: US Guided Needle Placement - No Linked Charges  Result Date: 11/21/2020 Limited diagnostic ultrasound of the left hip reveals a seroma which seems to communicate with the prosthesis. After sterile prep with Betadine and alcohol, injected 5 cc 1% lidocaine without epinephrine, then aspirated more than 20 cc of blood-tinged synovial fluid using an 18-gauge spinal needle.  The seroma was only decompressed.   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  We will then order an MRI scan to further evaluate.     Procedures: No procedures performed        PMFS History: Patient Active Problem List   Diagnosis Date Noted   Unilateral primary osteoarthritis, left hip 07/21/2020   PCP NOTES >>>>>>>>>>>> 08/29/2016   Annual physical exam 08/27/2016   Adhesive capsulitis of right shoulder 06/06/2015   Sinusitis,  chronic 08/26/2013   Labyrinthitis 08/26/2013   Dysfunction of both eustachian tubes 08/26/2013   Allergic rhinitis 08/26/2013   GERD 10/17/2009   RECTAL BLEEDING 10/17/2009   REFLUX ESOPHAGITIS, HX OF 10/17/2009   Past Medical History:  Diagnosis Date   Adhesive capsulitis of right shoulder 06/06/2015   Allergic rhinitis    GERD (gastroesophageal reflux disease)    Internal hemorrhoid    by colonoscopy 2011    Family History  Problem Relation Age of Onset   Cancer Paternal Aunt        colon   Cancer Maternal Grandfather        colon   Diabetes Father    CAD Father    Lung cancer Father        smoker   Prostate cancer Neg Hx     Past Surgical History:  Procedure Laterality Date   EYE SURGERY     x 2   HIP SURGERY Left    labrum repair , arthroscopy   SHOULDER ARTHROSCOPY Right 06/06/2015   Procedure: RIGHT SHOULDER ARTHROSCOPY WITH EXTENSIVE LABRAL DEBRIDEMENT AND MANIPULATION;  Surgeon: Marchia Bond, MD;  Location: [facility];  Service: Orthopedics;  Laterality: Right;   TOTAL HIP ARTHROPLASTY Left 08/15/2020   Procedure: LEFT TOTAL HIP ARTHROPLASTY ANTERIOR APPROACH;  Surgeon: [name], MD;  Location:  [facility];  Service: Orthopedics;  Laterality: Left;   Social History   Occupational History   Occupation: MD  Tobacco Use   Smoking status: Never   Smokeless tobacco: Former   Tobacco comments:    chews   Vaping Use   Vaping Use: Never used  Substance and Sexual Activity   Alcohol use: Yes    Comment: occas   Drug use: No   Sexual activity: Yes    Partners: Female

## 2020-12-05 ENCOUNTER — Encounter: Payer: Self-pay | Admitting: Internal Medicine

## 2020-12-05 ENCOUNTER — Other Ambulatory Visit: Payer: 59 | Admitting: *Deleted

## 2020-12-05 ENCOUNTER — Other Ambulatory Visit: Payer: Self-pay

## 2020-12-05 DIAGNOSIS — Z0000 Encounter for general adult medical examination without abnormal findings: Secondary | ICD-10-CM | POA: Diagnosis not present

## 2020-12-05 LAB — COMPREHENSIVE METABOLIC PANEL
ALT: 19 IU/L (ref 0–44)
AST: 18 IU/L (ref 0–40)
Albumin/Globulin Ratio: 1.8 (ref 1.2–2.2)
Albumin: 4.4 g/dL (ref 3.8–4.9)
Alkaline Phosphatase: 86 IU/L (ref 44–121)
BUN/Creatinine Ratio: 16 (ref 10–24)
BUN: 18 mg/dL (ref 8–27)
Bilirubin Total: 0.3 mg/dL (ref 0.0–1.2)
CO2: 22 mmol/L (ref 20–29)
Calcium: 9.3 mg/dL (ref 8.6–10.2)
Chloride: 103 mmol/L (ref 96–106)
Creatinine, Ser: 1.15 mg/dL (ref 0.76–1.27)
Globulin, Total: 2.4 g/dL (ref 1.5–4.5)
Glucose: 168 mg/dL — ABNORMAL HIGH (ref 65–99)
Potassium: 4.3 mmol/L (ref 3.5–5.2)
Sodium: 140 mmol/L (ref 134–144)
Total Protein: 6.8 g/dL (ref 6.0–8.5)
eGFR: 73 mL/min/{1.73_m2} (ref >59)

## 2020-12-05 LAB — LIPID PANEL
Chol/HDL Ratio: 4.5 ratio (ref 0.0–5.0)
Cholesterol, Total: 186 mg/dL (ref 100–199)
HDL: 41 mg/dL (ref >39)
LDL Chol Calc (NIH): 110 mg/dL — ABNORMAL HIGH (ref 0–99)
Triglycerides: 199 mg/dL — ABNORMAL HIGH (ref 0–149)
VLDL Cholesterol Cal: 35 mg/dL (ref 5–40)

## 2020-12-05 LAB — PSA: Prostate Specific Ag, Serum: 1 ng/mL (ref 0.0–4.0)

## 2020-12-05 LAB — T4, FREE: Free T4: 0.87 ng/dL (ref 0.82–1.77)

## 2020-12-05 LAB — TSH: TSH: 5.67 u[IU]/mL — ABNORMAL HIGH (ref 0.450–4.500)

## 2020-12-06 LAB — HEMOGLOBIN A1C
Est. average glucose Bld gHb Est-mCnc: 148 mg/dL
Hgb A1c MFr Bld: 6.8 % — ABNORMAL HIGH (ref 4.8–5.6)

## 2020-12-08 ENCOUNTER — Encounter: Payer: Self-pay | Admitting: Internal Medicine

## 2020-12-08 ENCOUNTER — Other Ambulatory Visit (HOSPITAL_COMMUNITY): Payer: Self-pay

## 2020-12-08 DIAGNOSIS — E119 Type 2 diabetes mellitus without complications: Secondary | ICD-10-CM

## 2020-12-08 MED ORDER — ROSUVASTATIN CALCIUM 5 MG PO TABS
5.0000 mg | ORAL_TABLET | Freq: Every day | ORAL | 3 refills | Status: DC
  Filled 2020-12-08: qty 90, 90d supply, fill #0

## 2020-12-08 NOTE — Addendum Note (Signed)
Addended byDamita Dunnings D on: 12/08/2020 02:58 PM   Modules accepted: Orders

## 2020-12-09 ENCOUNTER — Other Ambulatory Visit (HOSPITAL_COMMUNITY): Payer: Self-pay

## 2020-12-09 ENCOUNTER — Encounter: Payer: Self-pay | Admitting: Internal Medicine

## 2020-12-09 DIAGNOSIS — E119 Type 2 diabetes mellitus without complications: Secondary | ICD-10-CM | POA: Insufficient documentation

## 2020-12-09 MED ORDER — METFORMIN HCL 500 MG PO TABS
500.0000 mg | ORAL_TABLET | Freq: Two times a day (BID) | ORAL | 4 refills | Status: DC
  Filled 2020-12-09 – 2020-12-18 (×2): qty 60, 30d supply, fill #0
  Filled 2021-01-21: qty 60, 30d supply, fill #1
  Filled 2021-03-03: qty 60, 30d supply, fill #2
  Filled 2021-04-19: qty 60, 30d supply, fill #3

## 2020-12-09 NOTE — Addendum Note (Signed)
Addended byDamita Dunnings D on: 12/09/2020 07:41 AM   Modules accepted: Orders

## 2020-12-17 ENCOUNTER — Other Ambulatory Visit (HOSPITAL_COMMUNITY): Payer: Self-pay

## 2020-12-18 ENCOUNTER — Other Ambulatory Visit (HOSPITAL_COMMUNITY): Payer: Self-pay

## 2020-12-19 ENCOUNTER — Other Ambulatory Visit: Payer: Self-pay

## 2020-12-19 ENCOUNTER — Ambulatory Visit (INDEPENDENT_AMBULATORY_CARE_PROVIDER_SITE_OTHER): Payer: 59 | Admitting: Family Medicine

## 2020-12-19 ENCOUNTER — Ambulatory Visit: Payer: Self-pay

## 2020-12-19 DIAGNOSIS — Z96642 Presence of left artificial hip joint: Secondary | ICD-10-CM | POA: Diagnosis not present

## 2020-12-19 NOTE — Progress Notes (Signed)
Office Visit Note   Patient: Terry Hector, MD           Date of Birth: [birth year]           MRN: [id number] Visit Date: 12/19/2020 Requested by: Colon Branch, [address] [place],  [place] [address] PCP: Colon Branch, MD  Subjective: Chief Complaint  Patient presents with   Left Hip - Follow-up    3 months post THA. "95% there" Still some stiffness in the hip -- wants to make sure there is no more fluid (seroma)    HPI: He is here for recheck of his left hip.  Last aspiration of seroma helped quite a bit.  He feels like he is 95% improved, still some stiffness.  He wanted to make sure there was no fluid left.                ROS:   All other systems were reviewed and are negative.  Objective: Vital Signs: There were no vitals taken for this visit.  Physical Exam:  General:  Alert and oriented, in no acute distress. Pulm:  Breathing unlabored. Psy:  Normal mood, congruent affect.  Still some slight firmness to touch over the incision but no warmth or erythema.    Imaging: Limited diagnostic ultrasound of the left anterior hip reveals no evidence of seroma.   Assessment & Plan: Doing well status post left hip replacement, with no further evidence of seroma -Return as needed for any worsening stiffness.     Procedures: No procedures performed        PMFS History: Patient Active Problem List   Diagnosis Date Noted   Diabetes mellitus without complication ([place]) 88/41/6606   Unilateral primary osteoarthritis, left hip 07/21/2020   PCP NOTES >>>>>>>>>>>> 08/29/2016   Annual physical exam 08/27/2016   Adhesive capsulitis of right shoulder 06/06/2015   Sinusitis, chronic 08/26/2013   Labyrinthitis 08/26/2013   Dysfunction of both eustachian tubes 08/26/2013   Allergic rhinitis 08/26/2013   GERD 10/17/2009   RECTAL BLEEDING 10/17/2009   REFLUX ESOPHAGITIS, HX OF 10/17/2009   Past Medical History:  Diagnosis Date   Adhesive capsulitis of  right shoulder 06/06/2015   Allergic rhinitis    Diabetes ([place])    GERD (gastroesophageal reflux disease)    Internal hemorrhoid    by colonoscopy 2011    Family History  Problem Relation Age of Onset   Cancer Paternal Aunt        colon   Cancer Maternal Grandfather        colon   Diabetes Father    CAD Father    Lung cancer Father        smoker   Prostate cancer Neg Hx     Past Surgical History:  Procedure Laterality Date   EYE SURGERY     x 2   HIP SURGERY Left    labrum repair , arthroscopy   SHOULDER ARTHROSCOPY Right 06/06/2015   Procedure: RIGHT SHOULDER ARTHROSCOPY WITH EXTENSIVE LABRAL DEBRIDEMENT AND MANIPULATION;  Surgeon: Marchia Bond, MD;  Location: [facility];  Service: Orthopedics;  Laterality: Right;   TOTAL HIP ARTHROPLASTY Left 08/15/2020   Procedure: LEFT TOTAL HIP ARTHROPLASTY ANTERIOR APPROACH;  Surgeon: [name], MD;  Location: [facility];  Service: Orthopedics;  Laterality: Left;   Social History   Occupational History   Occupation: MD  Tobacco Use   Smoking status: Never   Smokeless tobacco: Former  Tobacco comments:    chews   Vaping Use   Vaping Use: Never used  Substance and Sexual Activity   Alcohol use: Yes    Comment: occas   Drug use: No   Sexual activity: Yes    Partners: Female

## 2021-01-13 ENCOUNTER — Encounter: Payer: Self-pay | Admitting: Internal Medicine

## 2021-01-13 DIAGNOSIS — E785 Hyperlipidemia, unspecified: Secondary | ICD-10-CM

## 2021-01-14 ENCOUNTER — Other Ambulatory Visit (HOSPITAL_COMMUNITY): Payer: Self-pay

## 2021-01-14 MED ORDER — REPATHA SURECLICK 140 MG/ML ~~LOC~~ SOAJ
1.0000 mL | SUBCUTANEOUS | 3 refills | Status: DC
  Filled 2021-01-14: qty 2, 28d supply, fill #0
  Filled 2021-02-13: qty 2, 28d supply, fill #1
  Filled 2021-03-09: qty 2, 28d supply, fill #2
  Filled 2021-04-19: qty 2, 28d supply, fill #3

## 2021-01-22 ENCOUNTER — Other Ambulatory Visit (HOSPITAL_COMMUNITY): Payer: Self-pay

## 2021-02-12 ENCOUNTER — Ambulatory Visit (INDEPENDENT_AMBULATORY_CARE_PROVIDER_SITE_OTHER): Payer: 59

## 2021-02-12 ENCOUNTER — Encounter: Payer: Self-pay | Admitting: Orthopaedic Surgery

## 2021-02-12 ENCOUNTER — Other Ambulatory Visit: Payer: Self-pay

## 2021-02-12 ENCOUNTER — Ambulatory Visit (INDEPENDENT_AMBULATORY_CARE_PROVIDER_SITE_OTHER): Payer: 59 | Admitting: Orthopaedic Surgery

## 2021-02-12 DIAGNOSIS — Z96642 Presence of left artificial hip joint: Secondary | ICD-10-CM

## 2021-02-12 NOTE — Progress Notes (Signed)
Terry Benson is now right at 26-monthstatus post a left total hip arthroplasty secondary to significant arthritis of his left hip.  He walks with a much more normal balance and gait.  He tolerates me easily putting his left hip through internal and external rotation with no difficulty at all.  His leg lengths are equal.  He still gets an inguinal and groin type of pain which is currently normal at 6 months after surgery.  I let him know that this can sometimes take a year to adjust but overall I think he looks great.  An AP pelvis and lateral of the left hip shows a well-seated total hip arthroplasty with bone ingrowth.  There is slight heterotopic bone in the posterior aspect of the soft tissue but this does not involve the joint and is not a large amount at all and not worrisome.  He can run occasionally from my standpoint.  He will continue to increase his activities as comfort allows.  I would like to see him back for final visit in 6 months with a final AP pelvis and lateral of the left operative hip.

## 2021-02-13 ENCOUNTER — Other Ambulatory Visit (HOSPITAL_COMMUNITY): Payer: Self-pay

## 2021-03-02 ENCOUNTER — Encounter: Payer: Self-pay | Admitting: Internal Medicine

## 2021-03-02 DIAGNOSIS — E119 Type 2 diabetes mellitus without complications: Secondary | ICD-10-CM

## 2021-03-02 DIAGNOSIS — E785 Hyperlipidemia, unspecified: Secondary | ICD-10-CM

## 2021-03-02 NOTE — Telephone Encounter (Signed)
Per Dr. Larose Kells- okay to order labs.

## 2021-03-03 ENCOUNTER — Other Ambulatory Visit (HOSPITAL_COMMUNITY): Payer: Self-pay

## 2021-03-04 ENCOUNTER — Other Ambulatory Visit: Payer: 59 | Admitting: *Deleted

## 2021-03-04 ENCOUNTER — Other Ambulatory Visit: Payer: Self-pay

## 2021-03-04 DIAGNOSIS — E785 Hyperlipidemia, unspecified: Secondary | ICD-10-CM | POA: Diagnosis not present

## 2021-03-04 DIAGNOSIS — E119 Type 2 diabetes mellitus without complications: Secondary | ICD-10-CM

## 2021-03-04 LAB — COMPREHENSIVE METABOLIC PANEL
ALT: 15 IU/L (ref 0–44)
AST: 16 IU/L (ref 0–40)
Albumin/Globulin Ratio: 2.2 (ref 1.2–2.2)
Albumin: 4.4 g/dL (ref 3.8–4.8)
Alkaline Phosphatase: 82 IU/L (ref 44–121)
BUN/Creatinine Ratio: 18 (ref 10–24)
BUN: 21 mg/dL (ref 8–27)
Bilirubin Total: 0.5 mg/dL (ref 0.0–1.2)
CO2: 23 mmol/L (ref 20–29)
Calcium: 9.2 mg/dL (ref 8.6–10.2)
Chloride: 104 mmol/L (ref 96–106)
Creatinine, Ser: 1.15 mg/dL (ref 0.76–1.27)
Globulin, Total: 2 g/dL (ref 1.5–4.5)
Glucose: 115 mg/dL — ABNORMAL HIGH (ref 65–99)
Potassium: 4.5 mmol/L (ref 3.5–5.2)
Sodium: 140 mmol/L (ref 134–144)
Total Protein: 6.4 g/dL (ref 6.0–8.5)
eGFR: 72 mL/min/{1.73_m2} (ref >59)

## 2021-03-04 LAB — HEMOGLOBIN A1C
Est. average glucose Bld gHb Est-mCnc: 137 mg/dL
Hgb A1c MFr Bld: 6.4 % — ABNORMAL HIGH (ref 4.8–5.6)

## 2021-03-04 LAB — LIPID PANEL
Chol/HDL Ratio: 2.7 ratio (ref 0.0–5.0)
Cholesterol, Total: 126 mg/dL (ref 100–199)
HDL: 46 mg/dL (ref >39)
LDL Chol Calc (NIH): 57 mg/dL (ref 0–99)
Triglycerides: 128 mg/dL (ref 0–149)
VLDL Cholesterol Cal: 23 mg/dL (ref 5–40)

## 2021-03-10 ENCOUNTER — Other Ambulatory Visit (HOSPITAL_COMMUNITY): Payer: Self-pay

## 2021-03-11 ENCOUNTER — Other Ambulatory Visit: Payer: Self-pay

## 2021-03-11 ENCOUNTER — Other Ambulatory Visit (HOSPITAL_COMMUNITY): Payer: Self-pay

## 2021-03-11 ENCOUNTER — Encounter: Payer: Self-pay | Admitting: Internal Medicine

## 2021-03-11 DIAGNOSIS — L03119 Cellulitis of unspecified part of limb: Secondary | ICD-10-CM

## 2021-03-11 DIAGNOSIS — R062 Wheezing: Secondary | ICD-10-CM

## 2021-03-11 MED ORDER — ALBUTEROL SULFATE HFA 108 (90 BASE) MCG/ACT IN AERS
2.0000 | INHALATION_SPRAY | Freq: Four times a day (QID) | RESPIRATORY_TRACT | 0 refills | Status: DC | PRN
  Filled 2021-03-11 – 2021-08-30 (×2): qty 18, 25d supply, fill #0

## 2021-03-11 MED ORDER — DOXYCYCLINE HYCLATE 100 MG PO CAPS
100.0000 mg | ORAL_CAPSULE | Freq: Two times a day (BID) | ORAL | 0 refills | Status: DC
  Filled 2021-03-11: qty 20, 10d supply, fill #0

## 2021-03-11 MED ORDER — ALBUTEROL SULFATE HFA 108 (90 BASE) MCG/ACT IN AERS
1.0000 | INHALATION_SPRAY | Freq: Four times a day (QID) | RESPIRATORY_TRACT | 5 refills | Status: DC | PRN
  Filled 2021-03-11: qty 18, 25d supply, fill #0
  Filled 2021-06-21: qty 18, 25d supply, fill #1

## 2021-04-19 ENCOUNTER — Encounter: Payer: Self-pay | Admitting: Internal Medicine

## 2021-04-20 ENCOUNTER — Other Ambulatory Visit (HOSPITAL_COMMUNITY): Payer: Self-pay

## 2021-04-20 MED ORDER — REPATHA SURECLICK 140 MG/ML ~~LOC~~ SOAJ
1.0000 mL | SUBCUTANEOUS | 1 refills | Status: DC
  Filled 2021-04-20: qty 6, 84d supply, fill #0
  Filled 2021-08-30 – 2021-09-01 (×2): qty 6, 84d supply, fill #1

## 2021-04-20 MED ORDER — METFORMIN HCL 500 MG PO TABS
500.0000 mg | ORAL_TABLET | Freq: Two times a day (BID) | ORAL | 1 refills | Status: DC
  Filled 2021-04-20: qty 180, 90d supply, fill #0
  Filled 2021-08-30: qty 180, 90d supply, fill #1

## 2021-05-01 IMAGING — MR MR HIP*L* W/O CM
4 of 6 series · 19 of 40 positions shown · non-contrast
Comparison: Plain films left hip 06/02/2016.

CLINICAL DATA: Low back and left hip pain for several months. No
known injury.

EXAM:
MR OF THE LEFT HIP WITHOUT CONTRAST
TECHNIQUE: Multiplanar, multisequence MR imaging was performed. No intravenous
contrast was administered.

[Series 2: T2 fat-sat · coronal · 4.0mm · 0.78mm/px · 3 of 28 slices shown (1 of 2)]
[im 5/28]
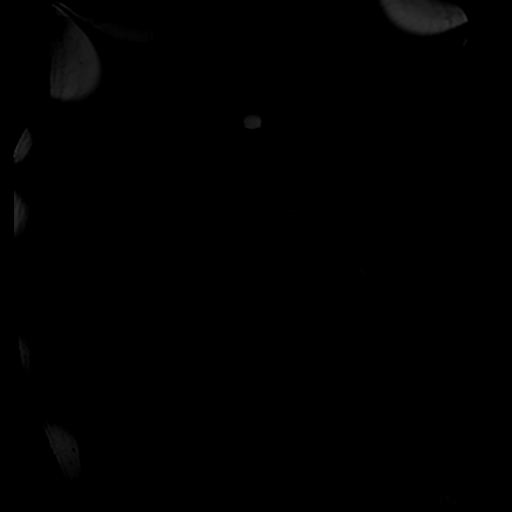
[im 14/28]
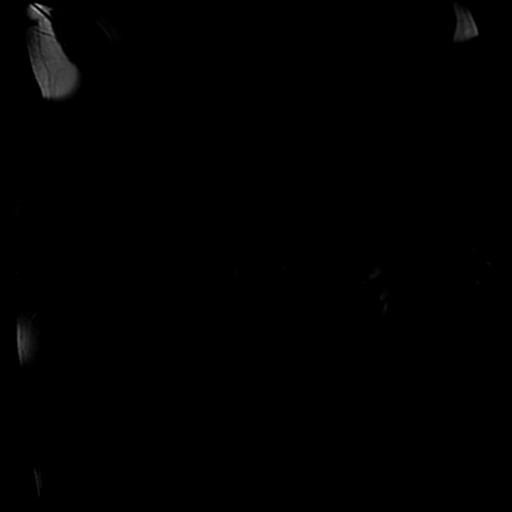
[im 23/28]
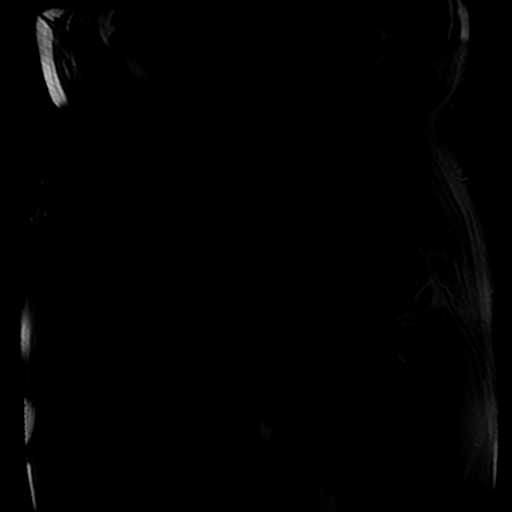

[Series 4: T2 fat-sat · axial · 4.0mm · 0.35mm/px · z∈[-8,+97]mm · 3 of 33 slices shown (2 of 2)]
[im 6/33]
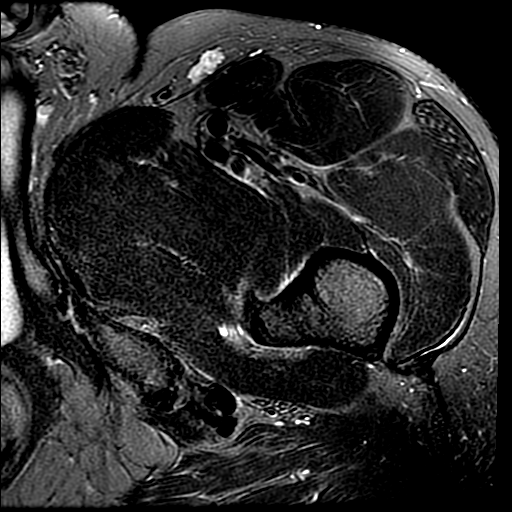
[im 17/33]
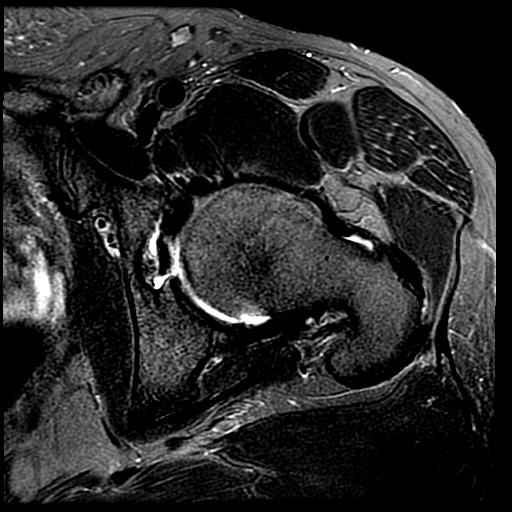
[im 27/33]
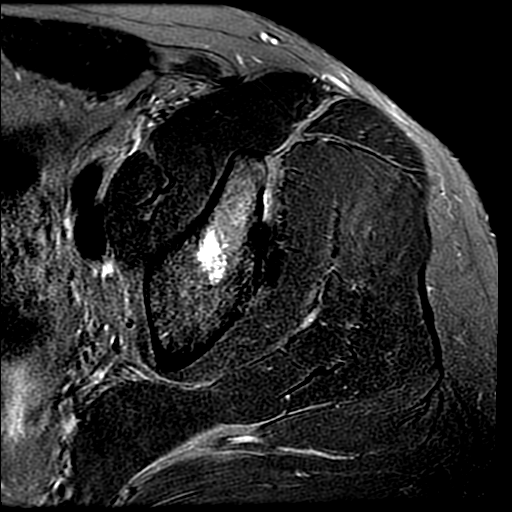

[Series 6: PD · axial · 4.0mm · 0.35mm/px · z∈[+27,+141]mm · 6 of 25 slices shown]
[im 1/25]
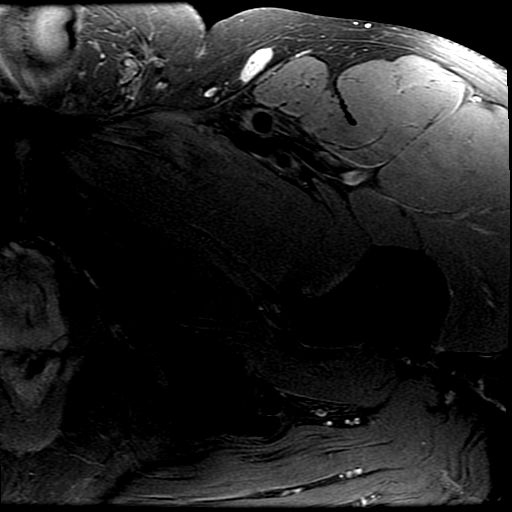
[im 5/25]
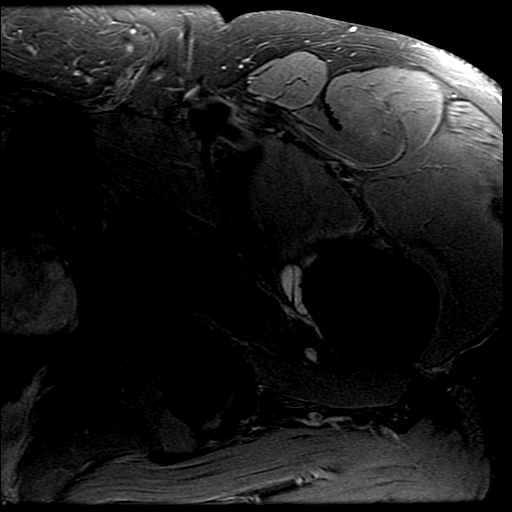
[im 10/25]
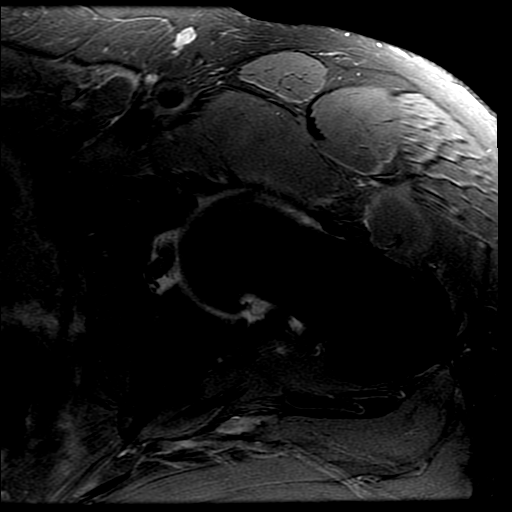
[im 15/25]
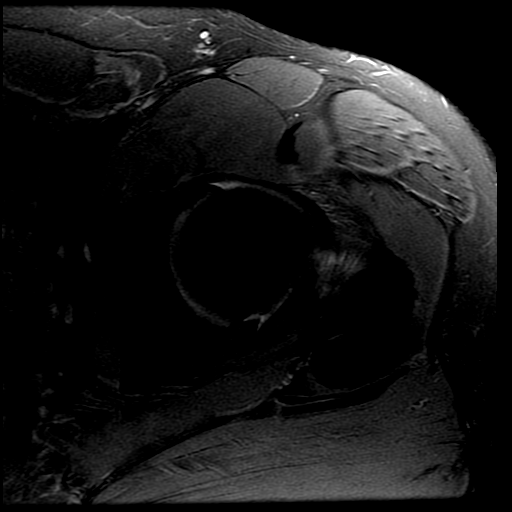
[im 20/25]
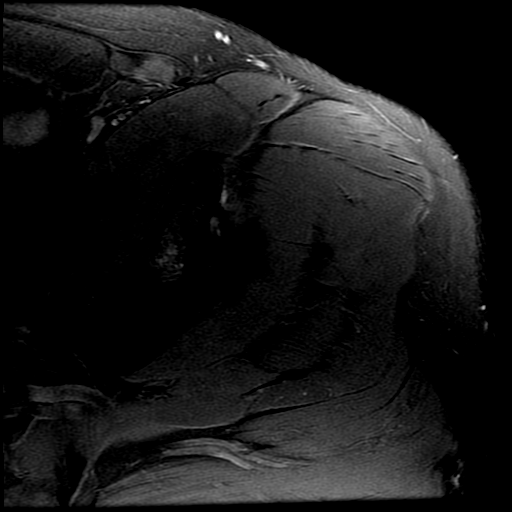
[im 25/25]
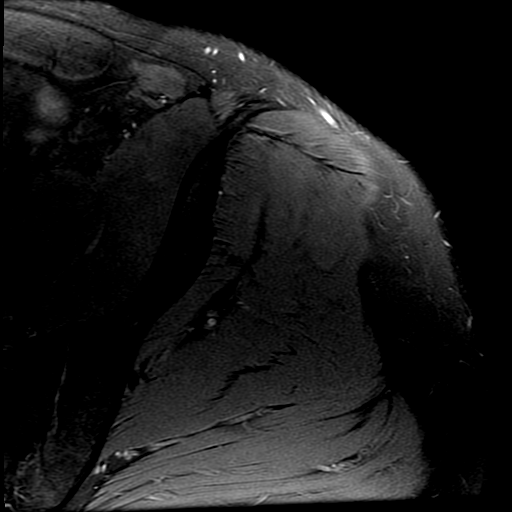

[Series 7: PD fat-sat · sagittal · 4.0mm · 0.35mm/px · 7 of 33 slices shown]
[im 1/33]
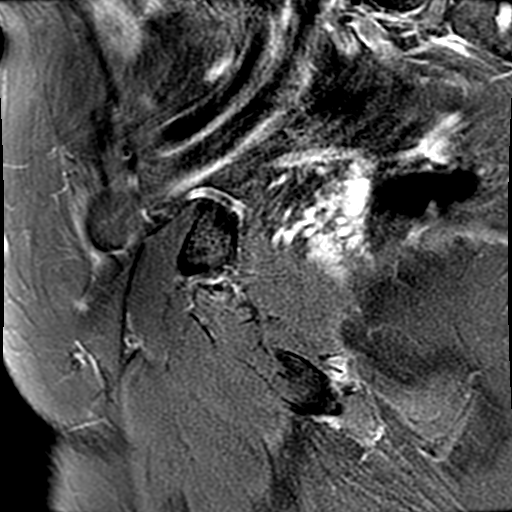
[im 6/33]
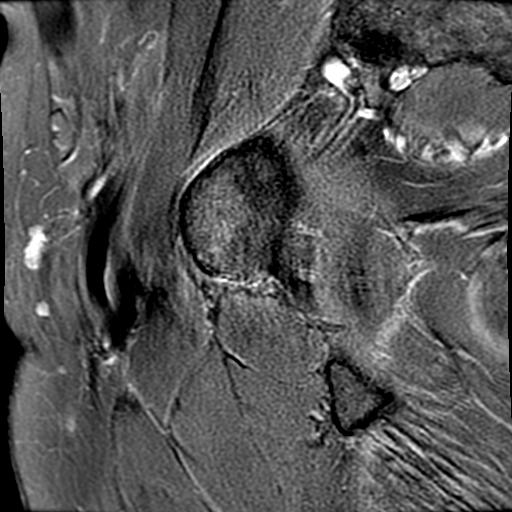
[im 11/33]
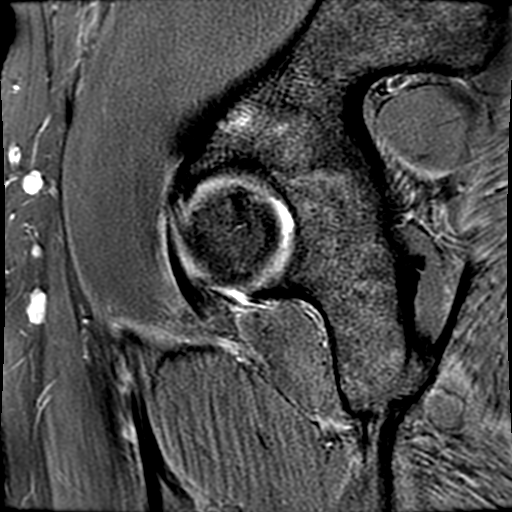
[im 17/33]
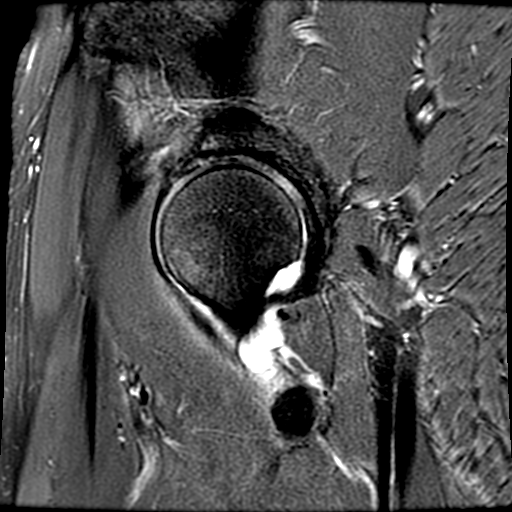
[im 22/33]
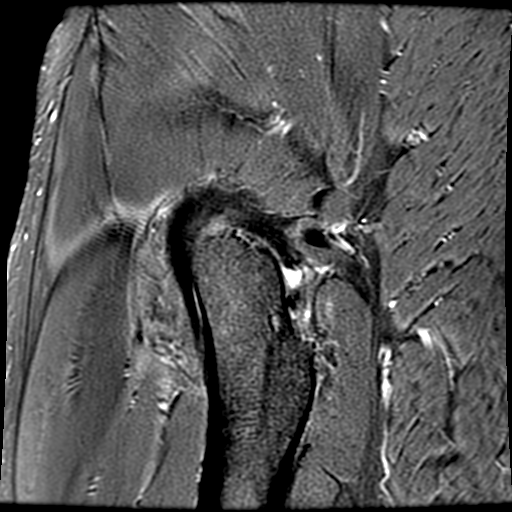
[im 27/33]
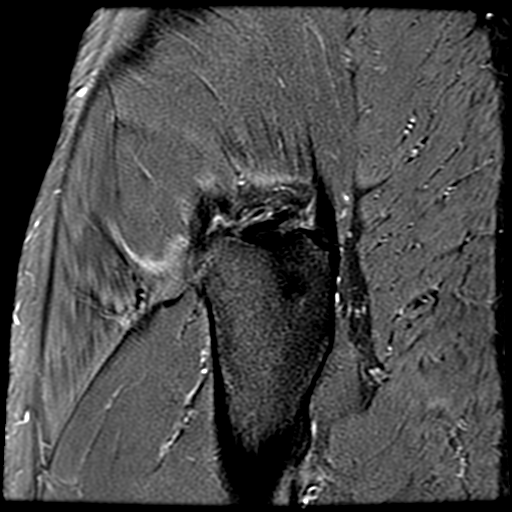
[im 33/33]
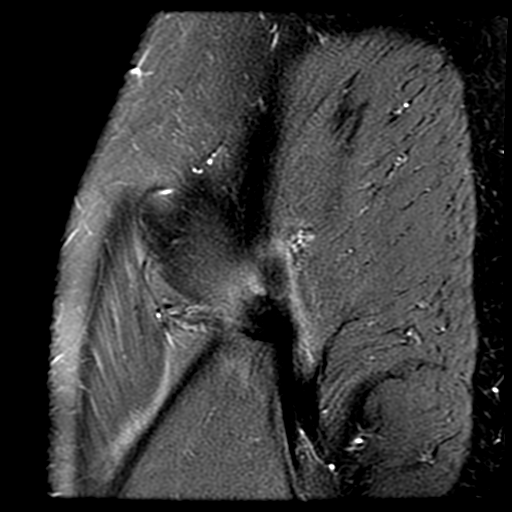

[19 of 40 positions shown; findings below may reference images not displayed]

FINDINGS: Bones: There is no acute bony or joint abnormality. No avascular
necrosis of the femoral heads or worrisome marrow lesion is
identified. Small subchondral cysts and mild edema are seen in the
left acetabulum. Convex bump at the anterior head neck junction of
the left hip is seen.

Articular cartilage and labrum

Articular cartilage: Left hip cartilage thinning is most notable
anteriorly and superiorly.

Labrum:  The anterior and superior labrum is degenerated and torn.

Joint or bursal effusion

Joint effusion:  None.

Bursae: Normal.

Muscles and tendons

Muscles and tendons:  Intact and normal in appearance.

Other findings

Miscellaneous:   Imaged intrapelvic contents appear normal.
IMPRESSION: Moderate appearing left hip osteoarthritis with associated
degenerative tearing of the anterior and superior labrum. Convex
bump at the anterior head neck junction of the left hip raises the
possibility of CAM type femoroacetabular impingement.

## 2021-05-05 ENCOUNTER — Other Ambulatory Visit (HOSPITAL_COMMUNITY): Payer: Self-pay

## 2021-05-05 MED ORDER — CARESTART COVID-19 HOME TEST VI KIT
PACK | 0 refills | Status: DC
Start: 2021-05-05 — End: 2021-06-24
  Filled 2021-05-05: qty 4, 4d supply, fill #0

## 2021-05-11 ENCOUNTER — Encounter: Payer: Self-pay | Admitting: Orthopaedic Surgery

## 2021-05-12 ENCOUNTER — Other Ambulatory Visit: Payer: Self-pay

## 2021-05-12 DIAGNOSIS — Z96642 Presence of left artificial hip joint: Secondary | ICD-10-CM

## 2021-05-12 NOTE — Telephone Encounter (Signed)
Order has been changed to [facility]

## 2021-05-14 ENCOUNTER — Ambulatory Visit (HOSPITAL_COMMUNITY)
Admission: RE | Admit: 2021-05-14 | Discharge: 2021-05-14 | Disposition: A | Discharge status: Home / Self Care | Payer: 59 | Source: Ambulatory Visit | Attending: Orthopaedic Surgery | Admitting: Orthopaedic Surgery

## 2021-05-14 ENCOUNTER — Other Ambulatory Visit: Payer: Self-pay

## 2021-05-14 DIAGNOSIS — M533 Sacrococcygeal disorders, not elsewhere classified: Secondary | ICD-10-CM | POA: Diagnosis not present

## 2021-05-14 DIAGNOSIS — M25552 Pain in left hip: Secondary | ICD-10-CM | POA: Diagnosis not present

## 2021-05-14 DIAGNOSIS — Z471 Aftercare following joint replacement surgery: Secondary | ICD-10-CM | POA: Diagnosis not present

## 2021-05-14 DIAGNOSIS — Z96642 Presence of left artificial hip joint: Secondary | ICD-10-CM | POA: Diagnosis not present

## 2021-06-04 LAB — HM DIABETES EYE EXAM

## 2021-06-11 ENCOUNTER — Encounter: Payer: Self-pay | Admitting: Internal Medicine

## 2021-06-22 ENCOUNTER — Other Ambulatory Visit (HOSPITAL_COMMUNITY): Payer: Self-pay

## 2021-06-24 ENCOUNTER — Encounter: Payer: Self-pay | Admitting: Internal Medicine

## 2021-06-24 ENCOUNTER — Ambulatory Visit (INDEPENDENT_AMBULATORY_CARE_PROVIDER_SITE_OTHER): Payer: 59 | Admitting: Internal Medicine

## 2021-06-24 VITALS — BP 134/78 | HR 79 | Temp 98.3°F | Resp 16 | Ht 69.0 in | Wt 192.0 lb

## 2021-06-24 DIAGNOSIS — Z23 Encounter for immunization: Secondary | ICD-10-CM | POA: Diagnosis not present

## 2021-06-24 DIAGNOSIS — E785 Hyperlipidemia, unspecified: Secondary | ICD-10-CM

## 2021-06-24 DIAGNOSIS — E119 Type 2 diabetes mellitus without complications: Secondary | ICD-10-CM

## 2021-06-24 DIAGNOSIS — R7989 Other specified abnormal findings of blood chemistry: Secondary | ICD-10-CM

## 2021-06-24 NOTE — Patient Instructions (Addendum)
° ° °  GO TO THE LAB : Get the blood work     GO TO THE FRONT DESK, PLEASE SCHEDULE YOUR APPOINTMENTS Physical exam in 5 to 6 months

## 2021-06-24 NOTE — Progress Notes (Signed)
Subjective:    Patient ID: Terry Hector, MD, male    DOB: [birth year], 61 y.o.   MRN: [id number]  DOS:  06/24/2021 Type of visit - description: Routine checkup  In general doing well. With talk about diabetes, high cholesterol. Also vaccinations  Review of Systems See above   Past Medical History:  Diagnosis Date   Adhesive capsulitis of right shoulder 06/06/2015   Allergic rhinitis    Diabetes ([place])    GERD (gastroesophageal reflux disease)    Internal hemorrhoid    by colonoscopy 2011    Past Surgical History:  Procedure Laterality Date   EYE SURGERY     x 2   HIP SURGERY Left    labrum repair , arthroscopy   SHOULDER ARTHROSCOPY Right 06/06/2015   Procedure: RIGHT SHOULDER ARTHROSCOPY WITH EXTENSIVE LABRAL DEBRIDEMENT AND MANIPULATION;  Surgeon: Marchia Bond, MD;  Location: [facility];  Service: Orthopedics;  Laterality: Right;   TOTAL HIP ARTHROPLASTY Left 08/15/2020   Procedure: LEFT TOTAL HIP ARTHROPLASTY ANTERIOR APPROACH;  Surgeon: [name], MD;  Location[facility];  Service: Orthopedics;  Laterality: Left;    Current Outpatient Medications  Medication Instructions   albuterol (VENTOLIN HFA) 108 (90 Base) MCG/ACT inhaler 1-2 puffs, Inhalation, Every 6 hours PRN   albuterol (VENTOLIN HFA) 108 (90 Base) MCG/ACT inhaler 2 puffs, Inhalation, Every 6 hours PRN   doxycycline (VIBRAMYCIN) 100 mg, Oral, 2 times daily   Evolocumab (REPATHA SURECLICK) 756 MG/ML SOAJ 1 mL, Subcutaneous, Every 14 days   loratadine (CLARITIN) 10 mg, Oral, Daily PRN   metFORMIN (GLUCOPHAGE) 500 mg, Oral, 2 times daily with meals       Objective:   Physical Exam BP 134/78 (BP Location: Left Arm, Patient Position: Sitting, Cuff Size: Small)    Pulse 79    Temp 98.3 F (36.8 C) (Oral)    Resp 16    Ht 5\' 9"  (1.753 m)    Wt 192 lb (87.1 kg)    SpO2 98%    BMI 28.35 kg/m  General:   Well developed, NAD, BMI noted. HEENT:  Normocephalic . Face symmetric,  atraumatic Lungs:  CTA B Normal respiratory effort, no intercostal retractions, no accessory muscle use. Heart: RRR,  no murmur.  DM foot exam: No edema, good pedal pulses, pinprick examination normal Skin: Not pale. Not jaundice Neurologic:  alert & oriented X3.  Speech normal, gait appropriate for age and unassisted Psych--  Cognition and judgment appear intact.  Cooperative with normal attention span and concentration.  Behavior appropriate. No anxious or depressed appearing.      Assessment     Assessment Mild DM:  A1c 6.5 ---2009 Dyslipidemia Allergies and RAD + FH CAD:has  a coronary calcium score every 3 years GERD: Multiple EGDs, 07/25/1997 esoph biopsy: Esophagitis, no Barrett's. LUTS MSK: Left hip pain on and off   PLAN: DM: On metformin 500 twice daily, declines to use the extended release formulation.  Check A1c and micro Foot exam: wnl High cholesterol: since LOV, had myalgias and aches with Crestor.  He is now on Repatha managed at the lipid clinic.  Last LDL very good. GERD: Well-controlled Increased TSH: Labs Preventive care: Had a flu shot, PNM 20 today, recommend a COVID booster. RTC 4 to 6 months CPX    This visit occurred during the SARS-CoV-2 public health emergency.  Safety protocols were in place, including screening questions prior to the visit, additional usage of staff PPE, and extensive cleaning of  exam room while observing appropriate contact time as indicated for disinfecting solutions.

## 2021-06-25 LAB — CBC WITH DIFFERENTIAL/PLATELET
Basophils Absolute: 0 10*3/uL (ref 0.0–0.1)
Basophils Relative: 0.4 % (ref 0.0–3.0)
Eosinophils Absolute: 0.1 10*3/uL (ref 0.0–0.7)
Eosinophils Relative: 1.9 % (ref 0.0–5.0)
HCT: 44.5 % (ref 39.0–52.0)
Hemoglobin: 14.7 g/dL (ref 13.0–17.0)
Lymphocytes Relative: 23.3 % (ref 12.0–46.0)
Lymphs Abs: 1.6 10*3/uL (ref 0.7–4.0)
MCHC: 33.1 g/dL (ref 30.0–36.0)
MCV: 86.9 fl (ref 78.0–100.0)
Monocytes Absolute: 0.3 10*3/uL (ref 0.1–1.0)
Monocytes Relative: 4.8 % (ref 3.0–12.0)
Neutro Abs: 4.7 10*3/uL (ref 1.4–7.7)
Neutrophils Relative %: 69.6 % (ref 43.0–77.0)
Platelets: 237 10*3/uL (ref 150.0–400.0)
RBC: 5.12 Mil/uL (ref 4.22–5.81)
RDW: 13.8 % (ref 11.5–15.5)
WBC: 6.7 10*3/uL (ref 4.0–10.5)

## 2021-06-25 LAB — BASIC METABOLIC PANEL
BUN: 24 mg/dL — ABNORMAL HIGH (ref 6–23)
CO2: 29 mEq/L (ref 19–32)
Calcium: 9.6 mg/dL (ref 8.4–10.5)
Chloride: 105 mEq/L (ref 96–112)
Creatinine, Ser: 1.39 mg/dL (ref 0.40–1.50)
GFR: 54.78 mL/min — ABNORMAL LOW (ref >60.00)
Glucose, Bld: 79 mg/dL (ref 70–99)
Potassium: 4.2 mEq/L (ref 3.5–5.1)
Sodium: 141 mEq/L (ref 135–145)

## 2021-06-25 LAB — MICROALBUMIN / CREATININE URINE RATIO
Creatinine,U: 213.8 mg/dL
Microalb Creat Ratio: 0.4 mg/g (ref 0.0–30.0)
Microalb, Ur: 0.8 mg/dL (ref 0.0–1.9)

## 2021-06-25 LAB — T4, FREE: Free T4: 0.76 ng/dL (ref 0.60–1.60)

## 2021-06-25 LAB — HEMOGLOBIN A1C: Hgb A1c MFr Bld: 6.8 % — ABNORMAL HIGH (ref 4.6–6.5)

## 2021-06-25 LAB — TSH: TSH: 3.35 u[IU]/mL (ref 0.35–5.50)

## 2021-06-25 NOTE — Assessment & Plan Note (Signed)
DM: On metformin 500 twice daily, declines to use the extended release formulation.  Check A1c and micro Foot exam: wnl High cholesterol: since LOV, had myalgias and aches with Crestor.  He is now on Repatha managed at the lipid clinic.  Last LDL very good. GERD: Well-controlled Increased TSH: Labs Preventive care: Had a flu shot, PNM 20 today, recommend a COVID booster. RTC 4 to 6 months CPX

## 2021-08-31 ENCOUNTER — Telehealth: Payer: Self-pay

## 2021-08-31 ENCOUNTER — Encounter: Payer: Self-pay | Admitting: Internal Medicine

## 2021-08-31 ENCOUNTER — Other Ambulatory Visit (HOSPITAL_COMMUNITY): Payer: Self-pay

## 2021-08-31 NOTE — Telephone Encounter (Signed)
PA initiated via Covermymeds; KEY: [id number]. Awaiting determination.  ?

## 2021-09-01 ENCOUNTER — Other Ambulatory Visit (HOSPITAL_COMMUNITY): Payer: Self-pay

## 2021-09-01 NOTE — Telephone Encounter (Signed)
PA approved. Effective 09/01/2021 to 09/01/2022 for maximum of 12 fills.  ?

## 2021-09-09 ENCOUNTER — Other Ambulatory Visit (HOSPITAL_COMMUNITY): Payer: Self-pay

## 2021-12-31 ENCOUNTER — Other Ambulatory Visit: Payer: Self-pay | Admitting: Family Medicine

## 2021-12-31 ENCOUNTER — Other Ambulatory Visit: Payer: Self-pay | Admitting: Internal Medicine

## 2021-12-31 ENCOUNTER — Other Ambulatory Visit (HOSPITAL_COMMUNITY): Payer: Self-pay

## 2021-12-31 DIAGNOSIS — R062 Wheezing: Secondary | ICD-10-CM

## 2021-12-31 MED ORDER — METFORMIN HCL 500 MG PO TABS
500.0000 mg | ORAL_TABLET | Freq: Two times a day (BID) | ORAL | 1 refills | Status: DC
  Filled 2021-12-31: qty 180, 90d supply, fill #0
  Filled 2022-05-10: qty 180, 90d supply, fill #1

## 2021-12-31 MED ORDER — REPATHA SURECLICK 140 MG/ML ~~LOC~~ SOAJ
1.0000 mL | SUBCUTANEOUS | 1 refills | Status: DC
  Filled 2021-12-31: qty 6, 84d supply, fill #0
  Filled 2022-05-10: qty 6, 84d supply, fill #1

## 2021-12-31 MED ORDER — ALBUTEROL SULFATE HFA 108 (90 BASE) MCG/ACT IN AERS
2.0000 | INHALATION_SPRAY | Freq: Four times a day (QID) | RESPIRATORY_TRACT | 5 refills | Status: AC | PRN
  Filled 2021-12-31: qty 8.5, 25d supply, fill #0
  Filled 2022-07-12: qty 6.7, 25d supply, fill #1

## 2022-02-16 ENCOUNTER — Other Ambulatory Visit (HOSPITAL_COMMUNITY): Payer: Self-pay

## 2022-02-16 ENCOUNTER — Ambulatory Visit: Payer: 59 | Admitting: Internal Medicine

## 2022-02-16 ENCOUNTER — Encounter: Payer: Self-pay | Admitting: Internal Medicine

## 2022-02-16 VITALS — BP 128/74 | HR 74 | Temp 98.4°F | Resp 18 | Ht 69.0 in | Wt 196.5 lb

## 2022-02-16 DIAGNOSIS — J069 Acute upper respiratory infection, unspecified: Secondary | ICD-10-CM

## 2022-02-16 DIAGNOSIS — M79622 Pain in left upper arm: Secondary | ICD-10-CM | POA: Diagnosis not present

## 2022-02-16 LAB — POCT RAPID STREP A (OFFICE): Rapid Strep A Screen: NEGATIVE

## 2022-02-16 MED ORDER — FLUTICASONE PROPIONATE 50 MCG/ACT NA SUSP
2.0000 | Freq: Every day | NASAL | 6 refills | Status: DC
  Filled 2022-02-16: qty 16, 30d supply, fill #0

## 2022-02-16 MED ORDER — PANTOPRAZOLE SODIUM 40 MG PO TBEC
40.0000 mg | DELAYED_RELEASE_TABLET | Freq: Every day | ORAL | 3 refills | Status: DC
  Filled 2022-02-16: qty 30, 30d supply, fill #0
  Filled 2022-05-10: qty 30, 30d supply, fill #1
  Filled 2022-07-12: qty 30, 30d supply, fill #2
  Filled 2022-10-01: qty 30, 30d supply, fill #3

## 2022-02-16 NOTE — Progress Notes (Unsigned)
Subjective:    Patient ID: Josue Hector, MD, male    DOB: [birth year], 61 y.o.   MRN: [id number]  DOS:  02/16/2022 Type of visit - description: Acute  Patient came back from [place] about 3 weeks ago. Shortly after his return developed a mild cough, ears felt stopped up and he developed sore throat.  The sore throat is at least moderate. At the time, he also had a flulike symptoms (fatigue, aches and pains.  No fever chills).  Also, had phlebitis at the left arm, acute symptoms quickly resolved with antibiotics however he has been left with soreness at the left cubital fossa when he left boxes and other things.  Tendinitis?.  Review of Systems See above   Past Medical History:  Diagnosis Date   Adhesive capsulitis of right shoulder 06/06/2015   Allergic rhinitis    Diabetes ([place])    GERD (gastroesophageal reflux disease)    Internal hemorrhoid    by colonoscopy 2011    Past Surgical History:  Procedure Laterality Date   EYE SURGERY     x 2   HIP SURGERY Left    labrum repair , arthroscopy   SHOULDER ARTHROSCOPY Right 06/06/2015   Procedure: RIGHT SHOULDER ARTHROSCOPY WITH EXTENSIVE LABRAL DEBRIDEMENT AND MANIPULATION;  Surgeon: Marchia Bond, MD;  Location: [facility];  Service: Orthopedics;  Laterality: Right;   TOTAL HIP ARTHROPLASTY Left 08/15/2020   Procedure: LEFT TOTAL HIP ARTHROPLASTY ANTERIOR APPROACH;  Surgeon: [name], MD;  [facility];  Service: Orthopedics;  Laterality: Left;    Current Outpatient Medications  Medication Instructions   albuterol (VENTOLIN HFA) 108 (90 Base) MCG/ACT inhaler 2 puffs, Inhalation, Every 6 hours PRN   Evolocumab (REPATHA SURECLICK) 166 MG/ML SOAJ 1 mL, Subcutaneous, Every 14 days   loratadine (CLARITIN) 10 mg, Oral, Daily PRN   metFORMIN (GLUCOPHAGE) 500 mg, Oral, 2 times daily with meals       Objective:   Physical Exam BP 128/74   Pulse 74   Temp 98.4 F (36.9 C) (Oral)   Resp 18   Ht 5'  9" (1.753 m)   Wt 196 lb 8 oz (89.1 kg)   SpO2 96%   BMI 29.02 kg/m  General:   Well developed, NAD, BMI noted. HEENT:  Normocephalic . Face symmetric, atraumatic TMs: Left TM normal, R TM is slightly bulged but otherwise normal Throat: Symmetric, no redness.  Tonsils not seen. Neck: No LAD's Lungs:  CTA B Normal respiratory effort, no intercostal retractions, no accessory muscle use. Heart: RRR,  no murmur. Upper extremities: Cubital fossas symmetric without obvious mass, phlebitis.  Biceps seems normal Lower extremities: no pretibial edema bilaterally  Skin: Not pale. Not jaundice Neurologic:  alert & oriented X3.  Speech normal, gait appropriate for age and unassisted Psych--  Cognition and judgment appear intact.  Cooperative with normal attention span and concentration.  Behavior appropriate. No anxious or depressed appearing.      Assessment     Assessment Mild DM:  A1c 6.5 ---2009 Dyslipidemia Allergies and RAD + FH CAD:has  a coronary calcium score every 3 years GERD: Multiple EGDs, 07/25/1997 esoph biopsy: Esophagitis, no Barrett's. LUTS MSK: Left hip pain on and off   PLAN: Sore throat, cough: Upper respiratory symptoms started about 3 weeks ago, initially he had viral syndrome.  Currently his main symptom is sore throat.  Exam is benign.  Rapid strep test negative. We will send throat culture, etiology of symptoms unclear, could  be allergies, post viral ST or heartburn (admits to some heartburn lately) Plan: Continue Allegra OTC, Flonase for few days, Protonix for 2 months.  Call if not gradually better Pain, left cubital fossa: Symptoms started after he developed phlebitis due to a blood drawn.  Patient would like some investigation done, refer to sports medicine for consideration of a ultrasound.  bicipital tendinitis?

## 2022-02-16 NOTE — Patient Instructions (Signed)
Once better recommend to proceed with covid booster (bivalent) and flu shot.

## 2022-02-17 NOTE — Assessment & Plan Note (Signed)
Sore throat, cough: Upper respiratory symptoms started about 3 weeks ago, initially he had viral syndrome.  Currently his main symptom is sore throat.  Exam is benign.  Rapid strep test negative. We will send throat culture, etiology of symptoms unclear, could be allergies, post viral ST or heartburn (admits to some heartburn lately) Plan: Continue Allegra OTC, Flonase for few days, Protonix for 2 months.  Call if not gradually better Pain, left cubital fossa: Symptoms started after he developed phlebitis due to a blood drawn.  Patient would like some investigation done, refer to sports medicine for consideration of a ultrasound.  bicipital tendinitis?

## 2022-02-18 LAB — CULTURE, GROUP A STREP
MICRO NUMBER:: 13872611
SPECIMEN QUALITY:: ADEQUATE

## 2022-02-23 ENCOUNTER — Encounter: Payer: Self-pay | Admitting: Internal Medicine

## 2022-02-23 ENCOUNTER — Other Ambulatory Visit (HOSPITAL_COMMUNITY): Payer: Self-pay

## 2022-02-23 DIAGNOSIS — J312 Chronic pharyngitis: Secondary | ICD-10-CM

## 2022-02-23 MED ORDER — AMOXICILLIN 875 MG PO TABS
875.0000 mg | ORAL_TABLET | Freq: Two times a day (BID) | ORAL | 0 refills | Status: DC
  Filled 2022-02-23: qty 20, 10d supply, fill #0

## 2022-02-23 MED ORDER — PREDNISONE 10 MG PO TABS
ORAL_TABLET | ORAL | 0 refills | Status: AC
  Filled 2022-02-23: qty 20, 8d supply, fill #0

## 2022-02-23 NOTE — Addendum Note (Signed)
Addended byDamita Dunnings D on: 02/23/2022 02:59 PM   Modules accepted: Orders

## 2022-02-23 NOTE — Telephone Encounter (Signed)
ENT referral DX persistent sore throat

## 2022-02-25 ENCOUNTER — Other Ambulatory Visit (HOSPITAL_COMMUNITY): Payer: Self-pay

## 2022-02-25 MED ORDER — MOLNUPIRAVIR EUA 200MG CAPSULE
4.0000 | ORAL_CAPSULE | Freq: Two times a day (BID) | ORAL | 0 refills | Status: AC
  Filled 2022-02-25: qty 40, 5d supply, fill #0

## 2022-02-25 NOTE — Telephone Encounter (Signed)
ENT referral placed, and molnupiravir sent.

## 2022-02-25 NOTE — Addendum Note (Signed)
Addended byDamita Dunnings D on: 02/25/2022 11:42 AM   Modules accepted: Orders

## 2022-02-25 NOTE — Telephone Encounter (Signed)
Send molnupiravir please

## 2022-02-26 ENCOUNTER — Ambulatory Visit: Payer: 59 | Admitting: Sports Medicine

## 2022-05-04 NOTE — Progress Notes (Unsigned)
    Terry Benson D.[place] Phone: [phone]   Assessment and Plan:     There are no diagnoses linked to this encounter.  ***   Pertinent previous records reviewed include ***   Follow Up: ***     Subjective:   I, Terry Benson, am serving as a Education administrator for Doctor Glennon Mac  Chief Complaint: left elbow pain   HPI:   05/05/2022 Patient is a 61 year old male complaining of left elbow pain. Patient states  Relevant Historical Information: ***  Additional pertinent review of systems negative.   Current Outpatient Medications:    albuterol (VENTOLIN HFA) 108 (90 Base) MCG/ACT inhaler, Inhale 2 puffs into the lungs every 6 (six) hours as needed for wheezing or shortness of breath., Disp: 8.5 g, Rfl: 5   amoxicillin (AMOXIL) 875 MG tablet, Take 1 tablet (875 mg total) by mouth 2 (two) times daily., Disp: 20 tablet, Rfl: 0   Evolocumab (REPATHA SURECLICK) 841 MG/ML SOAJ, Inject 1 mL into the skin every 14 (fourteen) days., Disp: 6 mL, Rfl: 1   fluticasone (FLONASE) 50 MCG/ACT nasal spray, Place 2 sprays into both nostrils daily., Disp: 16 g, Rfl: 6   loratadine (CLARITIN) 10 MG tablet, Take 10 mg by mouth daily as needed for allergies., Disp: , Rfl:    metFORMIN (GLUCOPHAGE) 500 MG tablet, Take 1 tablet (500 mg total) by mouth 2 (two) times daily with a meal., Disp: 180 tablet, Rfl: 1   pantoprazole (PROTONIX) 40 MG tablet, Take 1 tablet (40 mg total) by mouth daily before breakfast., Disp: 30 tablet, Rfl: 3   Objective:     There were no vitals filed for this visit.    There is no height or weight on file to calculate BMI.    Physical Exam:    ***   Electronically signed by:  Terry Benson D.[facility] 7:32 AM 05/04/22

## 2022-05-05 ENCOUNTER — Ambulatory Visit: Payer: Self-pay

## 2022-05-05 ENCOUNTER — Ambulatory Visit: Payer: 59 | Admitting: Family Medicine

## 2022-05-05 ENCOUNTER — Ambulatory Visit: Payer: 59 | Admitting: Sports Medicine

## 2022-05-05 ENCOUNTER — Other Ambulatory Visit (HOSPITAL_COMMUNITY): Payer: Self-pay

## 2022-05-05 VITALS — BP 120/80 | HR 84 | Ht 69.0 in | Wt 196.0 lb

## 2022-05-05 DIAGNOSIS — M25522 Pain in left elbow: Secondary | ICD-10-CM | POA: Diagnosis not present

## 2022-05-05 DIAGNOSIS — M7522 Bicipital tendinitis, left shoulder: Secondary | ICD-10-CM | POA: Diagnosis not present

## 2022-05-05 DIAGNOSIS — S76011A Strain of muscle, fascia and tendon of right hip, initial encounter: Secondary | ICD-10-CM

## 2022-05-05 MED ORDER — MELOXICAM 15 MG PO TABS
15.0000 mg | ORAL_TABLET | Freq: Every day | ORAL | 0 refills | Status: DC
  Filled 2022-05-05: qty 30, 30d supply, fill #0

## 2022-05-05 NOTE — Patient Instructions (Addendum)
Good to see you  - Start meloxicam 15 mg daily x2 weeks.  If still having pain after 2 weeks, complete 3rd-week of meloxicam. May use remaining meloxicam as needed once daily for pain control.  Do not to use additional NSAIDs while taking meloxicam.  May use Tylenol [phone] mg 2 to 3 times a day for breakthrough pain. Bicep HEP 3-4 week follow up

## 2022-05-10 ENCOUNTER — Other Ambulatory Visit (HOSPITAL_COMMUNITY): Payer: Self-pay

## 2022-06-15 ENCOUNTER — Ambulatory Visit: Payer: Self-pay | Admitting: Sports Medicine

## 2022-07-12 ENCOUNTER — Other Ambulatory Visit (HOSPITAL_COMMUNITY): Payer: Self-pay

## 2022-07-12 ENCOUNTER — Other Ambulatory Visit: Payer: Self-pay

## 2022-07-12 ENCOUNTER — Other Ambulatory Visit: Payer: Self-pay | Admitting: Sports Medicine

## 2022-07-12 ENCOUNTER — Other Ambulatory Visit: Payer: Self-pay | Admitting: Internal Medicine

## 2022-07-12 MED ORDER — METFORMIN HCL 500 MG PO TABS
500.0000 mg | ORAL_TABLET | Freq: Two times a day (BID) | ORAL | 0 refills | Status: DC
  Filled 2022-07-12: qty 180, 90d supply, fill #0
  Filled 2022-07-13: qty 60, 30d supply, fill #0
  Filled 2022-10-01: qty 60, 30d supply, fill #1
  Filled 2022-11-03: qty 60, 30d supply, fill #2

## 2022-07-13 ENCOUNTER — Other Ambulatory Visit (HOSPITAL_COMMUNITY): Payer: Self-pay

## 2022-07-15 ENCOUNTER — Other Ambulatory Visit (HOSPITAL_COMMUNITY): Payer: Self-pay

## 2022-07-20 ENCOUNTER — Encounter: Payer: Self-pay | Admitting: Internal Medicine

## 2022-07-20 ENCOUNTER — Other Ambulatory Visit (HOSPITAL_COMMUNITY): Payer: Self-pay

## 2022-07-20 ENCOUNTER — Ambulatory Visit (INDEPENDENT_AMBULATORY_CARE_PROVIDER_SITE_OTHER): Payer: Commercial Managed Care - PPO | Admitting: Internal Medicine

## 2022-07-20 VITALS — BP 132/80 | HR 76 | Temp 97.9°F | Resp 16 | Ht 69.0 in | Wt 195.0 lb

## 2022-07-20 DIAGNOSIS — R7989 Other specified abnormal findings of blood chemistry: Secondary | ICD-10-CM | POA: Diagnosis not present

## 2022-07-20 DIAGNOSIS — E785 Hyperlipidemia, unspecified: Secondary | ICD-10-CM | POA: Diagnosis not present

## 2022-07-20 DIAGNOSIS — E119 Type 2 diabetes mellitus without complications: Secondary | ICD-10-CM | POA: Diagnosis not present

## 2022-07-20 DIAGNOSIS — Z0000 Encounter for general adult medical examination without abnormal findings: Secondary | ICD-10-CM | POA: Diagnosis not present

## 2022-07-20 MED ORDER — MELOXICAM 15 MG PO TABS
15.0000 mg | ORAL_TABLET | Freq: Every day | ORAL | 2 refills | Status: DC
  Filled 2022-07-20: qty 30, 30d supply, fill #0
  Filled 2022-10-01 (×2): qty 30, 30d supply, fill #1
  Filled 2022-12-28: qty 30, 30d supply, fill #2

## 2022-07-20 NOTE — Progress Notes (Unsigned)
Subjective:    Patient ID: Terry Hector, MD, male    DOB: Jul 23, 1960, 62 y.o.   MRN: [id number]  DOS:  07/20/2022 Type of visit - description: CPX Here for CPX No major concerns.    Review of Systems See above   Past Medical History:  Diagnosis Date   Adhesive capsulitis of right shoulder 06/06/2015   Allergic rhinitis    Diabetes ([place])    GERD (gastroesophageal reflux disease)    Internal hemorrhoid    by colonoscopy 2011    Past Surgical History:  Procedure Laterality Date   EYE SURGERY     x 2   HIP SURGERY Left    labrum repair , arthroscopy   SHOULDER ARTHROSCOPY Right 06/06/2015   Procedure: RIGHT SHOULDER ARTHROSCOPY WITH EXTENSIVE LABRAL DEBRIDEMENT AND MANIPULATION;  Surgeon: Marchia Bond, MD;  Location: [facility];  Service: Orthopedics;  Laterality: Right;   TOTAL HIP ARTHROPLASTY Left 08/15/2020   Procedure: LEFT TOTAL HIP ARTHROPLASTY ANTERIOR APPROACH;  Surgeon: [name], MD;  Location: [facility];  Service: Orthopedics;  Laterality: Left;    Current Outpatient Medications  Medication Instructions   albuterol (VENTOLIN HFA) 108 (90 Base) MCG/ACT inhaler 2 puffs, Inhalation, Every 6 hours PRN   Evolocumab (REPATHA SURECLICK) 160 MG/ML SOAJ Inject 1 mL into the skin every 14 (fourteen) days.   loratadine (CLARITIN) 10 mg, Oral, Daily PRN   meloxicam (MOBIC) 15 mg, Oral, Daily   metFORMIN (GLUCOPHAGE) 500 mg, Oral, 2 times daily with meals   pantoprazole (PROTONIX) 40 mg, Oral, Daily before breakfast       Objective:   Physical Exam BP 132/80   Pulse 76   Temp 97.9 F (36.6 C) (Oral)   Resp 16   Ht '5\' 9"'$  (1.753 m)   Wt 195 lb (88.5 kg)   SpO2 97%   BMI 28.80 kg/m  General: Well developed, NAD, BMI noted Neck: No  thyromegaly.  No LAD's. HEENT:  Normocephalic . Face symmetric, atraumatic Lungs:  CTA B Normal respiratory effort, no intercostal retractions, no accessory muscle use. Heart: RRR,  no murmur.  Abdomen:   Not distended, soft, non-tender. No rebound or rigidity.   Lower extremities: no pretibial edema bilaterally  Skin: Exposed areas without rash. Not pale. Not jaundice Neurologic:  alert & oriented X3.  Speech normal, gait appropriate for age and unassisted Strength symmetric and appropriate for age.  Psych: Cognition and judgment appear intact.  Cooperative with normal attention span and concentration.  Behavior appropriate. No anxious or depressed appearing.     Assessment     Assessment Mild DM:  A1c 6.5 ---2009 Dyslipidemia Allergies and RAD + FH CAD:has  a coronary calcium score every 3 years GERD: Multiple EGDs, 07/25/1997 esoph biopsy: Esophagitis, no Barrett's. LUTS MSK: Left hip pain on and off   PLAN: Here for CPX DM: Last A1c 6.8, on metformin, exercising much more regularly, feels well, room for improvement on his diet.  We are checking A1c, micro, recommend regular ophthalmological checks. Dyslipidemia: On Repatha, checking labs Allergies, RAD: Hardly ever uses albuterol GERD: Hardly ever has symptoms MSK: Doing well, request a refill of meloxicam to be used as needed, recommend to take it with food. 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  cologuard (-)  10/2019. Will be due for Cologuard May 2024, I recommend to think about a colonoscopy.  He will let me know. --Prostate ca screening: No symptoms, check PSA --Diet and exercise discussed --chews tobacco, sees a dentist regularly -- labs: CMP FLP CBC A1c micro TSH T4 PSA - POA: Discussed and recommended === Sore throat, cough: Upper respiratory symptoms started about 3 weeks ago, initially he had viral syndrome.  Currently his main symptom is sore throat.  Exam is benign.  Rapid strep test negative. We will send throat culture, etiology of symptoms  unclear, could be allergies, post viral ST or heartburn (admits to some heartburn lately) Plan: Continue Allegra OTC, Flonase for few days, Protonix for 2 months.  Call if not gradually better Pain, left cubital fossa: Symptoms started after he developed phlebitis due to a blood drawn.  Patient would like some investigation done, refer to sports medicine for consideration of a ultrasound.  bicipital tendinitis?

## 2022-07-20 NOTE — Patient Instructions (Addendum)
Vaccines I recommend:  Covid booster RSV vaccine  Please bring [place] a copy of your Healthcare Power of Attorney for your chart.   Per our records you are due for your diabetic eye exam. Please contact your eye doctor to schedule an appointment. Please have them send copies of your office visit notes to [place]. Our fax number is [phone]. If you need a referral to an eye doctor please let us know.  Please take your labs tomorrow.  Next visit in 1 year

## 2022-07-21 ENCOUNTER — Encounter: Payer: Self-pay | Admitting: Internal Medicine

## 2022-07-21 LAB — MICROALBUMIN / CREATININE URINE RATIO
Creatinine,U: 226.6 mg/dL
Microalb Creat Ratio: 0.6 mg/g (ref 0.0–30.0)
Microalb, Ur: 1.5 mg/dL (ref 0.0–1.9)

## 2022-07-21 NOTE — Assessment & Plan Note (Signed)
Here for CPX DM: Last A1c 6.8, on metformin, exercising much more regularly, feels well, room for improvement on his diet.  We are checking A1c, micro, recommend regular ophthalmological checks. Dyslipidemia: On Repatha, checking labs Allergies, RAD: Hardly ever uses albuterol GERD: Hardly ever has symptoms MSK: Doing well, request a refill of meloxicam to be used as needed, recommend to take it with food. RTC 1 year

## 2022-07-21 NOTE — Assessment & Plan Note (Signed)
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  cologuard (-) 10/2019. Will be due for Cologuard May 2024, I recommend to think about a colonoscopy.  He will let me know. --Prostate ca screening: No symptoms, check PSA --Diet and exercise discussed --chews tobacco, sees a dentist regularly -- labs: CMP FLP CBC A1c micro TSH T4 PSA - POA: Discussed and recommended

## 2022-07-22 DIAGNOSIS — E119 Type 2 diabetes mellitus without complications: Secondary | ICD-10-CM | POA: Diagnosis not present

## 2022-07-22 DIAGNOSIS — Z0000 Encounter for general adult medical examination without abnormal findings: Secondary | ICD-10-CM | POA: Diagnosis not present

## 2022-07-22 DIAGNOSIS — E785 Hyperlipidemia, unspecified: Secondary | ICD-10-CM | POA: Diagnosis not present

## 2022-07-22 DIAGNOSIS — R7989 Other specified abnormal findings of blood chemistry: Secondary | ICD-10-CM | POA: Diagnosis not present

## 2022-07-23 LAB — CBC WITH DIFFERENTIAL/PLATELET
Basophils Absolute: 0 10*3/uL (ref 0.0–0.2)
Basos: 0 %
EOS (ABSOLUTE): 0.2 10*3/uL (ref 0.0–0.4)
Eos: 3 %
Hematocrit: 45.2 % (ref 37.5–51.0)
Hemoglobin: 15.2 g/dL (ref 13.0–17.7)
Immature Grans (Abs): 0 10*3/uL (ref 0.0–0.1)
Immature Granulocytes: 1 %
Lymphocytes Absolute: 1.8 10*3/uL (ref 0.7–3.1)
Lymphs: 30 %
MCH: 29 pg (ref 26.6–33.0)
MCHC: 33.6 g/dL (ref 31.5–35.7)
MCV: 86 fL (ref 79–97)
Monocytes Absolute: 0.4 10*3/uL (ref 0.1–0.9)
Monocytes: 7 %
Neutrophils Absolute: 3.6 10*3/uL (ref 1.4–7.0)
Neutrophils: 59 %
Platelets: 224 10*3/uL (ref 150–450)
RBC: 5.25 x10E6/uL (ref 4.14–5.80)
RDW: 12.8 % (ref 11.6–15.4)
WBC: 6 10*3/uL (ref 3.4–10.8)

## 2022-07-23 LAB — COMPREHENSIVE METABOLIC PANEL
ALT: 17 IU/L (ref 0–44)
AST: 17 IU/L (ref 0–40)
Albumin/Globulin Ratio: 1.9 (ref 1.2–2.2)
Albumin: 4.4 g/dL (ref 3.9–4.9)
Alkaline Phosphatase: 70 IU/L (ref 44–121)
BUN/Creatinine Ratio: 15 (ref 10–24)
BUN: 19 mg/dL (ref 8–27)
Bilirubin Total: 0.5 mg/dL (ref 0.0–1.2)
CO2: 20 mmol/L (ref 20–29)
Calcium: 9.1 mg/dL (ref 8.6–10.2)
Chloride: 104 mmol/L (ref 96–106)
Creatinine, Ser: 1.23 mg/dL (ref 0.76–1.27)
Globulin, Total: 2.3 g/dL (ref 1.5–4.5)
Glucose: 127 mg/dL — ABNORMAL HIGH (ref 70–99)
Potassium: 4.4 mmol/L (ref 3.5–5.2)
Sodium: 140 mmol/L (ref 134–144)
Total Protein: 6.7 g/dL (ref 6.0–8.5)
eGFR: 66 mL/min/{1.73_m2} (ref >59)

## 2022-07-23 LAB — TSH: TSH: 4.61 u[IU]/mL — ABNORMAL HIGH (ref 0.450–4.500)

## 2022-07-23 LAB — LIPID PANEL
Chol/HDL Ratio: 3.6 ratio (ref 0.0–5.0)
Cholesterol, Total: 148 mg/dL (ref 100–199)
HDL: 41 mg/dL (ref >39)
LDL Chol Calc (NIH): 87 mg/dL (ref 0–99)
Triglycerides: 107 mg/dL (ref 0–149)
VLDL Cholesterol Cal: 20 mg/dL (ref 5–40)

## 2022-07-23 LAB — T4, FREE: Free T4: 1.02 ng/dL (ref 0.82–1.77)

## 2022-07-23 LAB — HGB A1C W/O EAG: Hgb A1c MFr Bld: 6.5 % — ABNORMAL HIGH (ref 4.8–5.6)

## 2022-07-23 LAB — PSA: Prostate Specific Ag, Serum: 1 ng/mL (ref 0.0–4.0)

## 2022-08-16 ENCOUNTER — Encounter: Payer: Self-pay | Admitting: Gastroenterology

## 2022-08-20 ENCOUNTER — Telehealth: Payer: Self-pay

## 2022-08-20 NOTE — Telephone Encounter (Signed)
PA initiated via Covermymeds; KEY: [id number]. Awaiting determination.

## 2022-08-23 NOTE — Telephone Encounter (Signed)
PA approved.   The request has been approved. The authorization is effective from 09/02/2022 to 09/01/2023, as long as the member is enrolled in their current health plan. The request was approved as submitted. There is an active authorization on file for Repatha [employer] through [id number]; reference authorization [id number]. A written notification letter will follow with additional details.

## 2022-09-27 ENCOUNTER — Encounter: Payer: Self-pay | Admitting: Internal Medicine

## 2022-10-01 ENCOUNTER — Encounter: Payer: Self-pay | Admitting: Internal Medicine

## 2022-10-01 ENCOUNTER — Other Ambulatory Visit (HOSPITAL_COMMUNITY): Payer: Self-pay

## 2022-10-01 DIAGNOSIS — E119 Type 2 diabetes mellitus without complications: Secondary | ICD-10-CM

## 2022-10-04 ENCOUNTER — Other Ambulatory Visit (HOSPITAL_COMMUNITY): Payer: Self-pay

## 2022-10-04 MED ORDER — EMPAGLIFLOZIN 10 MG PO TABS
10.0000 mg | ORAL_TABLET | Freq: Every day | ORAL | 4 refills | Status: DC
  Filled 2022-10-04: qty 30, 30d supply, fill #0
  Filled 2022-11-03: qty 30, 30d supply, fill #1
  Filled 2022-12-28: qty 30, 30d supply, fill #2
  Filled 2023-03-31: qty 30, 30d supply, fill #3
  Filled 2023-05-31: qty 30, 30d supply, fill #4

## 2022-10-06 DIAGNOSIS — H53143 Visual discomfort, bilateral: Secondary | ICD-10-CM | POA: Diagnosis not present

## 2022-10-06 DIAGNOSIS — H52223 Regular astigmatism, bilateral: Secondary | ICD-10-CM | POA: Diagnosis not present

## 2022-10-06 DIAGNOSIS — H524 Presbyopia: Secondary | ICD-10-CM | POA: Diagnosis not present

## 2022-10-06 DIAGNOSIS — E119 Type 2 diabetes mellitus without complications: Secondary | ICD-10-CM | POA: Diagnosis not present

## 2022-10-06 DIAGNOSIS — H2513 Age-related nuclear cataract, bilateral: Secondary | ICD-10-CM | POA: Diagnosis not present

## 2022-10-06 DIAGNOSIS — H5202 Hypermetropia, left eye: Secondary | ICD-10-CM | POA: Diagnosis not present

## 2022-10-06 LAB — HM DIABETES EYE EXAM

## 2022-10-11 ENCOUNTER — Telehealth: Payer: Self-pay | Admitting: Internal Medicine

## 2022-10-11 ENCOUNTER — Encounter: Payer: Self-pay | Admitting: Internal Medicine

## 2022-10-11 NOTE — Telephone Encounter (Signed)
Terry Benson or Terry Benson, Dr Eden Emms  takes Terry Benson as prescribed, please provide samples of Jardiance 10 mg 1 tablet daily if available.  Thank you JP

## 2022-10-12 MED ORDER — EMPAGLIFLOZIN 10 MG PO TABS: 10.0000 mg | ORAL_TABLET | Freq: Every day | ORAL | 0 refills | Status: DC

## 2022-11-03 ENCOUNTER — Other Ambulatory Visit: Payer: Self-pay | Admitting: Internal Medicine

## 2022-11-03 ENCOUNTER — Other Ambulatory Visit (HOSPITAL_COMMUNITY): Payer: Self-pay

## 2022-11-03 MED ORDER — REPATHA SURECLICK 140 MG/ML ~~LOC~~ SOAJ
1.0000 mL | SUBCUTANEOUS | 1 refills | Status: DC
  Filled 2022-11-03: qty 6, 84d supply, fill #0
  Filled 2023-05-31: qty 6, 84d supply, fill #1

## 2022-11-04 ENCOUNTER — Other Ambulatory Visit (HOSPITAL_COMMUNITY): Payer: Self-pay

## 2022-11-04 ENCOUNTER — Other Ambulatory Visit: Payer: Self-pay

## 2022-11-19 ENCOUNTER — Other Ambulatory Visit: Payer: Self-pay

## 2022-11-19 DIAGNOSIS — Z1211 Encounter for screening for malignant neoplasm of colon: Secondary | ICD-10-CM

## 2022-11-23 DIAGNOSIS — Z1211 Encounter for screening for malignant neoplasm of colon: Secondary | ICD-10-CM | POA: Diagnosis not present

## 2022-11-29 LAB — COLOGUARD: COLOGUARD: NEGATIVE

## 2022-12-13 DIAGNOSIS — L281 Prurigo nodularis: Secondary | ICD-10-CM | POA: Diagnosis not present

## 2022-12-13 DIAGNOSIS — D225 Melanocytic nevi of trunk: Secondary | ICD-10-CM | POA: Diagnosis not present

## 2022-12-13 DIAGNOSIS — L821 Other seborrheic keratosis: Secondary | ICD-10-CM | POA: Diagnosis not present

## 2022-12-13 DIAGNOSIS — D235 Other benign neoplasm of skin of trunk: Secondary | ICD-10-CM | POA: Diagnosis not present

## 2022-12-13 DIAGNOSIS — D2371 Other benign neoplasm of skin of right lower limb, including hip: Secondary | ICD-10-CM | POA: Diagnosis not present

## 2022-12-13 DIAGNOSIS — D2262 Melanocytic nevi of left upper limb, including shoulder: Secondary | ICD-10-CM | POA: Diagnosis not present

## 2022-12-13 DIAGNOSIS — B078 Other viral warts: Secondary | ICD-10-CM | POA: Diagnosis not present

## 2022-12-13 DIAGNOSIS — D485 Neoplasm of uncertain behavior of skin: Secondary | ICD-10-CM | POA: Diagnosis not present

## 2022-12-24 ENCOUNTER — Ambulatory Visit: Payer: Commercial Managed Care - PPO | Attending: Internal Medicine

## 2022-12-24 ENCOUNTER — Telehealth: Payer: Self-pay | Admitting: Internal Medicine

## 2022-12-24 ENCOUNTER — Other Ambulatory Visit: Payer: Self-pay | Admitting: Internal Medicine

## 2022-12-24 DIAGNOSIS — E038 Other specified hypothyroidism: Secondary | ICD-10-CM | POA: Diagnosis not present

## 2022-12-24 DIAGNOSIS — E119 Type 2 diabetes mellitus without complications: Secondary | ICD-10-CM

## 2022-12-24 LAB — BASIC METABOLIC PANEL

## 2022-12-24 LAB — CBC WITH DIFFERENTIAL/PLATELET
Basophils Absolute: 0 10*3/uL (ref 0.0–0.2)
Basos: 0 %
EOS (ABSOLUTE): 0.2 10*3/uL (ref 0.0–0.4)
Eos: 3 %
Immature Grans (Abs): 0 10*3/uL (ref 0.0–0.1)
Lymphocytes Absolute: 1.7 10*3/uL (ref 0.7–3.1)
MCH: 29 pg (ref 26.6–33.0)
MCV: 87 fL (ref 79–97)
Monocytes Absolute: 0.5 10*3/uL (ref 0.1–0.9)
Monocytes: 7 %
Neutrophils: 63 %
Platelets: 230 10*3/uL (ref 150–450)
RBC: 5.63 x10E6/uL (ref 4.14–5.80)
RDW: 13.2 % (ref 11.6–15.4)

## 2022-12-24 NOTE — Addendum Note (Signed)
Addended by: Willow Ora E on: 12/24/2022 10:47 AM   Modules accepted: Orders

## 2022-12-24 NOTE — Addendum Note (Signed)
Addended bySilvio Pate on: 12/24/2022 10:28 AM   Modules accepted: Orders

## 2022-12-24 NOTE — Telephone Encounter (Signed)
All orders signed. CBC, BMP, A1c. Also TSH and free T4 --subclinical hypothyroidism

## 2022-12-24 NOTE — Telephone Encounter (Signed)
Pt's secretary, Elita Quick, called to request labs for the pt CBC, BMET, & Hemoglobin. Please advise the pt when order has been placed.

## 2022-12-24 NOTE — Addendum Note (Signed)
Addended by: CREFT, Melton Alar L on: 12/24/2022 11:54 AM   Modules accepted: Orders

## 2022-12-24 NOTE — Telephone Encounter (Signed)
Pended. Sign if appropriate. 

## 2022-12-25 LAB — CBC WITH DIFFERENTIAL/PLATELET
Hematocrit: 48.9 % (ref 37.5–51.0)
Hemoglobin: 16.3 g/dL (ref 13.0–17.7)
Immature Granulocytes: 1 %
Lymphs: 26 %
MCHC: 33.3 g/dL (ref 31.5–35.7)
Neutrophils Absolute: 4.2 10*3/uL (ref 1.4–7.0)
WBC: 6.6 10*3/uL (ref 3.4–10.8)

## 2022-12-25 LAB — TSH: TSH: 5.08 u[IU]/mL — ABNORMAL HIGH (ref 0.450–4.500)

## 2022-12-25 LAB — BASIC METABOLIC PANEL
BUN: 19 mg/dL (ref 8–27)
Creatinine, Ser: 1.32 mg/dL — ABNORMAL HIGH (ref 0.76–1.27)
Sodium: 141 mmol/L (ref 134–144)

## 2022-12-25 LAB — HEMOGLOBIN A1C
Est. average glucose Bld gHb Est-mCnc: 143 mg/dL
Hgb A1c MFr Bld: 6.6 % — ABNORMAL HIGH (ref 4.8–5.6)

## 2022-12-25 LAB — T4, FREE: Free T4: 1.03 ng/dL (ref 0.82–1.77)

## 2022-12-28 ENCOUNTER — Other Ambulatory Visit: Payer: Self-pay | Admitting: Internal Medicine

## 2022-12-29 ENCOUNTER — Other Ambulatory Visit (HOSPITAL_COMMUNITY): Payer: Self-pay

## 2022-12-29 ENCOUNTER — Other Ambulatory Visit: Payer: Self-pay

## 2022-12-29 MED ORDER — METFORMIN HCL 500 MG PO TABS
500.0000 mg | ORAL_TABLET | Freq: Two times a day (BID) | ORAL | 1 refills | Status: DC
  Filled 2022-12-29: qty 180, 90d supply, fill #0
  Filled 2023-03-31: qty 180, 90d supply, fill #1

## 2022-12-29 MED ORDER — PANTOPRAZOLE SODIUM 40 MG PO TBEC
40.0000 mg | DELAYED_RELEASE_TABLET | Freq: Every day | ORAL | 1 refills | Status: AC
  Filled 2022-12-29: qty 90, 90d supply, fill #0

## 2022-12-29 NOTE — Addendum Note (Signed)
Addended byConrad [employer] D on: 12/29/2022 08:46 AM   Modules accepted: Orders

## 2022-12-30 ENCOUNTER — Encounter: Payer: Self-pay | Admitting: Gastroenterology

## 2023-03-09 ENCOUNTER — Other Ambulatory Visit (INDEPENDENT_AMBULATORY_CARE_PROVIDER_SITE_OTHER): Payer: Self-pay

## 2023-03-09 ENCOUNTER — Other Ambulatory Visit: Payer: Self-pay | Admitting: Internal Medicine

## 2023-03-09 ENCOUNTER — Other Ambulatory Visit (HOSPITAL_COMMUNITY): Payer: Self-pay

## 2023-03-09 ENCOUNTER — Encounter: Payer: Self-pay | Admitting: Orthopaedic Surgery

## 2023-03-09 ENCOUNTER — Ambulatory Visit: Payer: Commercial Managed Care - PPO | Admitting: Orthopaedic Surgery

## 2023-03-09 DIAGNOSIS — Z96642 Presence of left artificial hip joint: Secondary | ICD-10-CM | POA: Diagnosis not present

## 2023-03-09 DIAGNOSIS — M25552 Pain in left hip: Secondary | ICD-10-CM | POA: Diagnosis not present

## 2023-03-09 MED ORDER — MELOXICAM 15 MG PO TABS
15.0000 mg | ORAL_TABLET | Freq: Every day | ORAL | 3 refills | Status: DC | PRN
  Filled 2023-03-09: qty 30, 30d supply, fill #0
  Filled 2023-09-15: qty 30, 30d supply, fill #1

## 2023-03-09 MED ORDER — MELOXICAM 15 MG PO TABS
15.0000 mg | ORAL_TABLET | Freq: Every day | ORAL | 2 refills | Status: DC
  Filled 2023-03-09: qty 30, 30d supply, fill #0

## 2023-03-09 MED ORDER — LIDOCAINE HCL 1 % IJ SOLN
3.0000 mL | INTRAMUSCULAR | Status: AC | PRN
Start: 2023-03-09 — End: 2023-03-09
  Administered 2023-03-09: 3 mL

## 2023-03-09 MED ORDER — METHYLPREDNISOLONE ACETATE 40 MG/ML IJ SUSP
40.0000 mg | INTRAMUSCULAR | Status: AC | PRN
Start: 2023-03-09 — End: 2023-03-09
  Administered 2023-03-09: 40 mg via INTRA_ARTICULAR

## 2023-03-09 NOTE — Progress Notes (Signed)
[name] comes in today with left hip pain.  We actually replaced his left hip back in March 2022.  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  All of his pain seems to be around the trochanteric area of his left hip.  When I have her lay supine his leg lengths are equal.  He is got good range of motion of both his hips and is just some slight tightness on the left side.  He does have pain over the trochanteric area and the tip of the trochanteric area and we were able to pinpoint this area and I did feel it was reasonable to try steroid injection over this area which she agreed to and tolerated well.  An x-ray of his pelvis and his left hip shows some heterotopic ossification but this is not where he is hurting.  I see no evidence of worrisome features of the prosthesis itself.  He is not having any specific back symptoms or radicular symptoms going down the leg.  I would like to send him for MRI of his left hip to rule out any types of muscle or tendon tears or issues around the trochanteric area of his left hip.  We can also assess the implant itself for prosthetic loosening.  I would also like to send him to outpatient physical therapy here for any modalities that they can provide did help decrease his left hip pain and can give him an exercise program for stretching as well.  He agrees with this treatment plan.  We will work on getting him follow-up once we have the MRI and see how he does with physical therapy and to see how he responds to that injection.  He will also let me know as well.    Procedure Note  Patient: Terry Stade, MD             Date of Birth: [birth year]           MRN: [id number]             Visit Date: 03/09/2023  Procedures: Visit Diagnoses:  1. History of left hip replacement     Large Joint Inj: L greater trochanter on 03/09/2023 12:13 PM Indications: pain and diagnostic evaluation Details:  22 G 1.5 in needle, lateral approach  Arthrogram: No  Medications: 3 mL lidocaine 1 %; 40 mg methylPREDNISolone acetate 40 MG/ML Outcome: tolerated well, no immediate complications Procedure, treatment alternatives, risks and benefits explained, specific risks discussed. Consent was given by the patient. Immediately prior to procedure a time out was called to verify the correct patient, procedure, equipment, support staff and site/side marked as required. Patient was prepped and draped in the usual sterile fashion.

## 2023-03-10 ENCOUNTER — Other Ambulatory Visit: Payer: Self-pay

## 2023-03-10 ENCOUNTER — Other Ambulatory Visit (HOSPITAL_COMMUNITY): Payer: Self-pay

## 2023-03-10 DIAGNOSIS — Z96642 Presence of left artificial hip joint: Secondary | ICD-10-CM

## 2023-03-11 ENCOUNTER — Ambulatory Visit (HOSPITAL_COMMUNITY)
Admission: RE | Admit: 2023-03-11 | Discharge: 2023-03-11 | Disposition: A | Discharge status: Home / Self Care | Payer: Commercial Managed Care - PPO | Source: Ambulatory Visit | Attending: Orthopaedic Surgery | Admitting: Orthopaedic Surgery

## 2023-03-11 DIAGNOSIS — M1612 Unilateral primary osteoarthritis, left hip: Secondary | ICD-10-CM | POA: Diagnosis not present

## 2023-03-11 DIAGNOSIS — Z96642 Presence of left artificial hip joint: Secondary | ICD-10-CM | POA: Insufficient documentation

## 2023-03-11 DIAGNOSIS — M25552 Pain in left hip: Secondary | ICD-10-CM | POA: Diagnosis not present

## 2023-03-11 DIAGNOSIS — Z471 Aftercare following joint replacement surgery: Secondary | ICD-10-CM | POA: Diagnosis not present

## 2023-03-16 ENCOUNTER — Telehealth: Payer: Self-pay | Admitting: Orthopaedic Surgery

## 2023-03-16 NOTE — Telephone Encounter (Signed)
Spoke with Dr.Flegal he asked if Dr. Magnus Ivan can get someone from [facility] to send over the images so that he can get the results. Dr. Eden Emms said he has (PT) next week and would like to have results before the weekend.  The number to contact [phone]

## 2023-03-21 ENCOUNTER — Ambulatory Visit: Payer: Commercial Managed Care - PPO | Admitting: Physical Therapy

## 2023-03-21 ENCOUNTER — Encounter: Payer: Self-pay | Admitting: Physical Therapy

## 2023-03-21 ENCOUNTER — Other Ambulatory Visit: Payer: Self-pay

## 2023-03-21 DIAGNOSIS — M25552 Pain in left hip: Secondary | ICD-10-CM

## 2023-03-21 DIAGNOSIS — M25652 Stiffness of left hip, not elsewhere classified: Secondary | ICD-10-CM | POA: Diagnosis not present

## 2023-03-21 NOTE — Therapy (Signed)
OUTPATIENT PHYSICAL THERAPY LOWER EXTREMITY EVALUATION   Patient Name: Terry ABERCROMBIE, MD MRN: [id number] DOB:[birth year], 62 y.o., male Today's Date: 03/21/2023  END OF SESSION:  PT End of Session - 03/21/23 1318     Visit Number 1    Number of Visits 5    Date for PT Re-Evaluation 05/11/23    Authorization Type [facility] [employer] PPO    PT Start Time 1015    PT Stop Time 1058    PT Time Calculation (min) 43 min    Activity Tolerance Patient tolerated treatment well    Behavior During Therapy [facility] for tasks assessed/performed             Past Medical History:  Diagnosis Date   Adhesive capsulitis of right shoulder 06/06/2015   Allergic rhinitis    Diabetes ([facility])    GERD (gastroesophageal reflux disease)    Internal hemorrhoid    by colonoscopy 2011   Past Surgical History:  Procedure Laterality Date   EYE SURGERY     x 2   HIP SURGERY Left    labrum repair , arthroscopy   SHOULDER ARTHROSCOPY Right 06/06/2015   Procedure: RIGHT SHOULDER ARTHROSCOPY WITH EXTENSIVE LABRAL DEBRIDEMENT AND MANIPULATION;  Surgeon: Teryl Lucy, MD;  Location: [facility];  Service: Orthopedics;  Laterality: Right;   TOTAL HIP ARTHROPLASTY Left 08/15/2020   Procedure: LEFT TOTAL HIP ARTHROPLASTY ANTERIOR APPROACH;  Surgeon: Kathryne Hitch, MD;  Location: [facility];  Service: Orthopedics;  Laterality: Left;   Patient Active Problem List   Diagnosis Date Noted   Diabetes mellitus without complication ([facility]) 12/09/2020   Unilateral primary osteoarthritis, left hip 07/21/2020   PCP NOTES >>>>>>>>>>>> 08/29/2016   Annual physical exam 08/27/2016   Adhesive capsulitis of right shoulder 06/06/2015   Labyrinthitis 08/26/2013   Dysfunction of both eustachian tubes 08/26/2013   Allergic rhinitis 08/26/2013   GERD 10/17/2009   RECTAL BLEEDING 10/17/2009   REFLUX ESOPHAGITIS, HX OF 10/17/2009    PCP: Willow Ora, MD  REFERRING PROVIDER: Doneen Poisson, MD  REFERRING  DIAG: [phone] (ICD-10-CM) - Presence of left artificial hip joint   THERAPY DIAG:  Pain in left hip  Stiffness of left hip, not elsewhere classified  Rationale for Evaluation and Treatment: Rehabilitation  ONSET DATE: 03/10/2023 MD referral to PT  SUBJECTIVE:   SUBJECTIVE STATEMENT: Patient has history of left Total Hip Arthroplasty on 08/15/2020. Over the last year he has been having left hip pain with limping 4-5 days / week.  He notices weakness in left hip.  For Exercise he cycles, hip stretches, hip exercises without weights and walks 2 dogs (65# & 45#) ~1 mile. Pain increases with uneven terrain.   PERTINENT HISTORY: left Total Hip Arthroplasty 08/15/2020, right Shoulder Arthroscopy 2016, DM2  PAIN:  NPRS scale: today 4/10, in last week 0/10 & 8-9/10 Pain location: left hip laterally over trochanter & posterior over hip.  Pain description:  ache Aggravating factors: standing & walking, certain stretches Relieving factors: Medicine,   PRECAUTIONS: None  WEIGHT BEARING RESTRICTIONS: No  FALLS:  Has patient fallen in last 6 months? 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  PLOF: Independent  PATIENT GOALS:   to function without pain, jog a mile  Next  MD visit:  OBJECTIVE:  DIAGNOSTIC FINDINGS: x-ray of his pelvis and his left hip shows some heterotopic ossification but this is not where he is hurting   PATIENT SURVEYS:  FOTO intake:  69%  predicted:  75%  COGNITION: Overall cognitive status: WFL   MUSCLE LENGTH: Hamstrings:  Left 60 deg Thomas test: Left -22 deg  POSTURE: No Significant postural limitations  PALPATION: Tenderness to palpation left hip Quadratus Lumborum, Piriformis, Glut Medius and ITB proximal  50%  LOWER EXTREMITY ROM:   ROM Right eval Left eval  Hip flexion  105*  Hip extension  Thomas -22  Hip abduction  WNL  Hip adduction    Hip internal rotation  15*  Hip external rotation  15*  Knee flexion    Knee extension    Ankle dorsiflexion    Ankle plantarflexion    Ankle inversion    Ankle eversion     (Blank rows = not tested)  LOWER EXTREMITY MMT:  MMT Right eval Left eval  Hip flexion  5/5  Hip extension  5/5  Hip abduction  5/5  Hip adduction    Hip internal rotation  5/5  Hip external rotation  5/5  Knee flexion    Knee extension    Ankle dorsiflexion    Ankle plantarflexion    Ankle inversion    Ankle eversion     (Blank rows = not tested)  LOWER EXTREMITY SPECIAL TESTS:  Hip special tests: Ober's test: positive   GAIT: Distance walked: >300' and negotiated stairs without rail  Assistive device utilized: None Level of assistance: Modified independence Comments: antalgic with decreased LLE stance duration   TODAY'S TREATMENT                                                                          DATE: 03/21/2023 Manual Therapy: Manual therapy performed by Ivery Quale, PT,DPT for active compression and skilled palpation and Trigger Point Dry-Needling  Treatment instructions: Expect mild to moderate muscle soreness. Patient Consent Given: Yes Education handout provided: verbally provided Muscles treated: Left side Lumbar paraspinals, QL and multifidi, glutes, piriformis, TFL Estim combined: NO Treatment response/outcome: good overall tolerance, twitch response noted    PATIENT EDUCATION:   HOME EXERCISE PROGRAM: Pt demo his current workout which includes squats, standing hip abduction & extension, lunge stretch.   ASSESSMENT: CLINICAL IMPRESSION: Patient is a 62 y.o. who comes to clinic with complaints of left hip pain with mobility, strength and movement coordination deficits that impair their ability to perform usual daily and  recreational functional activities without increase difficulty/symptoms at this time.  Patient to benefit from skilled PT services to address impairments and limitations to improve to previous level of function without restriction secondary to condition.   OBJECTIVE IMPAIRMENTS: decreased activity tolerance, decreased ROM, and pain.   ACTIVITY LIMITATIONS: standing, squatting, stairs, and locomotion level  PARTICIPATION LIMITATIONS: community activity, occupation, and yard work  PERSONAL FACTORS: Time since onset of injury/illness/exacerbation are also affecting patient's functional outcome.   REHAB POTENTIAL: Good  CLINICAL DECISION MAKING: Stable/uncomplicated  EVALUATION COMPLEXITY: Low   GOALS: Goals reviewed with patient? Yes  SHORT TERM GOALS: (target date for Short term goals 04/05/2023)   1.  Patient  will demonstrate independent use of home exercise program to maintain progress from in clinic treatments. Baseline: See objective data Goal status: Initial   LONG TERM GOALS: (target dates for all long term goals  05/11/2023)   1. Patient will demonstrate/report pain </=2/10 to facilitate minimal limitation in daily activity secondary to pain symptoms. Baseline: See objective data Goal status: Initial   2. Patient will demonstrate independent use of home exercise program to facilitate ability to maintain/progress functional gains from skilled physical therapy services. Baseline: See objective data Goal status: Initial   3. Patient will demonstrate FOTO outcome > or = 75 % to indicate reduced disability due to condition. Baseline: See objective data Goal status: Initial   4.  Patient will demonstrate left hip ROM WNL to faciltiate usual transfers, stairs, squatting at [facility] for daily life.  Baseline: See objective data Goal status: Initial    PLAN:  PT FREQUENCY:  1x every 1-2 weeks for 5 visits  PT DURATION: over 8 weeks  PLANNED INTERVENTIONS: Therapeutic  exercises, Therapeutic activity, Neuro Muscular re-education, Balance training, Gait training, Patient/Family education, Joint mobilization, Stair training, DME instructions, Dry Needling, Electrical stimulation, Traction, Cryotherapy, vasopneumatic deviceMoist heat, Taping, Ultrasound, Ionotophoresis 4mg /ml Dexamethasone, and aquatic therapy, Manual therapy.  All included unless contraindicated  PLAN FOR NEXT SESSION:  check response to dry needling. Instruct in HEP for hip stretches.    Vladimir Faster, PT, DPT 03/21/2023, 1:34 PM

## 2023-04-04 ENCOUNTER — Other Ambulatory Visit: Payer: Self-pay

## 2023-04-04 ENCOUNTER — Encounter: Payer: Self-pay | Admitting: Orthopaedic Surgery

## 2023-04-04 DIAGNOSIS — G8929 Other chronic pain: Secondary | ICD-10-CM

## 2023-04-05 ENCOUNTER — Encounter: Payer: Self-pay | Admitting: Physical Therapy

## 2023-04-05 ENCOUNTER — Ambulatory Visit: Payer: Commercial Managed Care - PPO | Admitting: Physical Therapy

## 2023-04-05 DIAGNOSIS — M25552 Pain in left hip: Secondary | ICD-10-CM | POA: Diagnosis not present

## 2023-04-05 DIAGNOSIS — M25652 Stiffness of left hip, not elsewhere classified: Secondary | ICD-10-CM | POA: Diagnosis not present

## 2023-04-05 NOTE — Therapy (Addendum)
OUTPATIENT PHYSICAL THERAPY TREATMENT  / DISCHARGE  Patient Name: Terry ROTAR, MD MRN: [id number] DOB:Aug 19, 1960, 62 y.o., male Today's Date: 04/05/2023  END OF SESSION:  PT End of Session - 04/05/23 1300     Visit Number 2    Number of Visits 5    Date for PT Re-Evaluation 05/11/23    Authorization Type [employer]    PT Start Time 1300    PT Stop Time 1338    PT Time Calculation (min) 38 min    Activity Tolerance Patient tolerated treatment well    Behavior During Therapy [facility] for tasks assessed/performed             Past Medical History:  Diagnosis Date   Adhesive capsulitis of right shoulder 06/06/2015   Allergic rhinitis    Diabetes (HCC)    GERD (gastroesophageal reflux disease)    Internal hemorrhoid    by colonoscopy 2011   Past Surgical History:  Procedure Laterality Date   EYE SURGERY     x 2   HIP SURGERY Left    labrum repair , arthroscopy   SHOULDER ARTHROSCOPY Right 06/06/2015   Procedure: RIGHT SHOULDER ARTHROSCOPY WITH EXTENSIVE LABRAL DEBRIDEMENT AND MANIPULATION;  Surgeon: Teryl Lucy, MD;  Location: [facility];  Service: Orthopedics;  Laterality: Right;   TOTAL HIP ARTHROPLASTY Left 08/15/2020   Procedure: LEFT TOTAL HIP ARTHROPLASTY ANTERIOR APPROACH;  Surgeon: Kathryne Hitch, MD;  Location: [facility];  Service: Orthopedics;  Laterality: Left;   Patient Active Problem List   Diagnosis Date Noted   Diabetes mellitus without complication (HCC) 12/09/2020   Unilateral primary osteoarthritis, left hip 07/21/2020   PCP NOTES >>>>>>>>>>>> 08/29/2016   Annual physical exam 08/27/2016   Adhesive capsulitis of right shoulder 06/06/2015   Labyrinthitis 08/26/2013   Dysfunction of both eustachian tubes 08/26/2013   Allergic rhinitis 08/26/2013   GERD 10/17/2009   RECTAL BLEEDING 10/17/2009   REFLUX ESOPHAGITIS, HX OF 10/17/2009    PCP: Willow Ora, MD  REFERRING PROVIDER: Doneen Poisson, MD  REFERRING DIAG:  [phone] (ICD-10-CM) - Presence of left artificial hip joint   THERAPY DIAG:  Pain in left hip  Stiffness of left hip, not elsewhere classified  Rationale for Evaluation and Treatment: Rehabilitation  ONSET DATE: 03/10/2023 MD referral to PT  SUBJECTIVE:   SUBJECTIVE STATEMENT: He relays pain is about the same, he is still limping around. He had MRI of his hip which was fine but will get MRI of lumbar on 04/18/23  PERTINENT HISTORY: left Total Hip Arthroplasty 08/15/2020, right Shoulder Arthroscopy 2016, DM2  PAIN:  NPRS scale: today 4/10, in last week 0/10 & 8-9/10 Pain location: left hip laterally over trochanter & posterior over hip.  Pain description:  ache Aggravating factors: standing & walking, certain stretches Relieving factors: Medicine,   PRECAUTIONS: None  WEIGHT BEARING RESTRICTIONS: No  FALLS:  Has patient fallen in last 6 months? 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 Stable postsurgical changes status post left total hip arthroplasty. No evidence of prosthetic loosening or periarticular fluid collection. 2. 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 No surrounding inflammation or focal fluid collection. 3. No acute findings. 4. Stable appearance of the left iliopsoas tendon.  PATIENT SURVEYS:  FOTO intake:  69%  predicted:  75%  COGNITION: Overall cognitive status: WFL   MUSCLE LENGTH: Hamstrings:  Left  60 deg Thomas test: Left -22 deg  POSTURE: No Significant postural limitations  PALPATION: Tenderness to palpation left hip Quadratus Lumborum, Piriformis, Glut Medius and ITB proximal 50%  LOWER EXTREMITY ROM:   ROM Right eval Left eval  Hip flexion  105*  Hip extension  Thomas -22  Hip abduction  WNL  Hip adduction    Hip internal rotation  15*  Hip external rotation  15*  Knee flexion    Knee extension    Ankle dorsiflexion    Ankle plantarflexion    Ankle inversion    Ankle eversion     (Blank rows = not tested)  LOWER EXTREMITY MMT:  MMT Right eval Left eval  Hip flexion  5/5  Hip extension  5/5  Hip abduction  5/5  Hip adduction    Hip internal rotation  5/5  Hip external rotation  5/5  Knee flexion    Knee extension    Ankle dorsiflexion    Ankle plantarflexion    Ankle inversion    Ankle eversion     (Blank rows = not tested)  LOWER EXTREMITY SPECIAL TESTS:  Hip special tests: Ober's test: positive   GAIT: Distance walked: >300' and negotiated stairs without rail  Assistive device utilized: None Level of assistance: Modified independence Comments: antalgic with decreased LLE stance duration   TODAY'S TREATMENT                                                                          DATE: 04/05/2023 Manual Therapy: Manual therapy performed by Ivery Quale, PT,DPT for active compression and skilled palpation and Trigger Point Dry-Needling  Treatment instructions: Expect mild to moderate muscle soreness. Patient Consent Given: Yes Education handout provided: verbally provided Muscles treated: Left side Lumbar paraspinals, QL and multifidi, glutes, piriformis, TFL Estim combined: YES used micro current at 50 frequency as well as mili amp current at 2 frequency with intensity turned up to tolerance.  Treatment response/outcome: good overall tolerance, twitch response noted   Therex: HEP creation and review, sent text for link for illustrated  instructions  03/21/2023 Manual Therapy: Manual therapy performed by Ivery Quale, PT,DPT for active compression and skilled palpation and Trigger Point Dry-Needling  Treatment instructions: Expect mild to moderate muscle soreness. Patient Consent Given: Yes Education handout provided: verbally provided Muscles treated: Left side Lumbar paraspinals, QL and multifidi, glutes, piriformis, TFL Estim combined: NO Treatment response/outcome: good overall tolerance, twitch response noted    PATIENT EDUCATION:   HOME EXERCISE PROGRAM: Access Code: [email or web address] URL: [email or web address] Date: 04/05/2023 Prepared by: Ivery Quale  Exercises - Seated Piriformis Stretch with Trunk Bend  - 2 x daily - 6 x weekly - 1 sets - 3 reps - 30 hold - Seated Piriformis Stretch  - 2 x  daily - 6 x weekly - 1 sets - 3 reps - 30 hold - Supine Quadriceps Stretch with Strap on Table  - 2 x daily - 6 x weekly - 3 reps - 30 hold - Figure 4 Bridge  - 2 x daily - 6 x weekly - 1-2 sets - 10 reps - Supine Lower Trunk Rotation  - 2 x daily - 6 x weekly - 1 sets - 10 reps - 5 sec hold - Standing Lumbar Extension at Wall - Forearms  - 2 x daily - 6 x weekly - 1-2 sets - 10 reps - 5 sec hold - Standing Quadratus Lumborum Stretch with Doorway  - 2 x daily - 6 x weekly - 1 sets - 3 reps - 30 hold  ASSESSMENT: CLINICAL IMPRESSION: I did combine Estim to DN today to see if this will help provide any benefit as he did not notice any significant improvement from traditional DN last time. I also provided him with him/lumbar HEP to trial and he will also have MRI of lumbar before next PT visit.  OBJECTIVE IMPAIRMENTS: decreased activity tolerance, decreased ROM, and pain.   ACTIVITY LIMITATIONS: standing, squatting, stairs, and locomotion level  PARTICIPATION LIMITATIONS: community activity, occupation, and yard work  PERSONAL FACTORS: Time since onset of injury/illness/exacerbation are also affecting patient's  functional outcome.   REHAB POTENTIAL: Good  CLINICAL DECISION MAKING: Stable/uncomplicated  EVALUATION COMPLEXITY: Low   GOALS: Goals reviewed with patient? Yes  SHORT TERM GOALS: (target date for Short term goals 04/05/2023)   1.  Patient will demonstrate independent use of home exercise program to maintain progress from in clinic treatments. Baseline: See objective data Goal status: Initial   LONG TERM GOALS: (target dates for all long term goals  05/11/2023)   1. Patient will demonstrate/report pain </=2/10 to facilitate minimal limitation in daily activity secondary to pain symptoms. Baseline: See objective data Goal status: Initial   2. Patient will demonstrate independent use of home exercise program to facilitate ability to maintain/progress functional gains from skilled physical therapy services. Baseline: See objective data Goal status: Initial   3. Patient will demonstrate FOTO outcome > or = 75 % to indicate reduced disability due to condition. Baseline: See objective data Goal status: Initial   4.  Patient will demonstrate left hip ROM WNL to faciltiate usual transfers, stairs, squatting at [facility] for daily life.  Baseline: See objective data Goal status: Initial    PLAN:  PT FREQUENCY:  1x every 1-2 weeks for 5 visits  PT DURATION: over 8 weeks  PLANNED INTERVENTIONS: Therapeutic exercises, Therapeutic activity, Neuro Muscular re-education, Balance training, Gait training, Patient/Family education, Joint mobilization, Stair training, DME instructions, Dry Needling, Electrical stimulation, Traction, Cryotherapy, vasopneumatic deviceMoist heat, Taping, Ultrasound, Ionotophoresis 4mg /ml Dexamethasone, and aquatic therapy, Manual therapy.  All included unless contraindicated  PLAN FOR NEXT SESSION:  check response to dry needling with Estim. Are there any lumbar MRI results.   April Manson, PT, DPT 04/05/2023, 1:00 PM   PHYSICAL THERAPY DISCHARGE  SUMMARY  Visits from Start of Care: 2  Current functional level related to goals / functional outcomes: See note   Remaining deficits: See note   Education / Equipment: HEP  Patient goals were partially met. Patient is being discharged due to not returning since the last visit.  Chyrel Masson, PT, DPT, OCS, ATC 06/09/23  2:51 PM

## 2023-04-06 ENCOUNTER — Encounter: Payer: Self-pay | Admitting: Sports Medicine

## 2023-04-06 ENCOUNTER — Other Ambulatory Visit: Payer: Self-pay

## 2023-04-06 ENCOUNTER — Ambulatory Visit: Payer: Commercial Managed Care - PPO | Admitting: Sports Medicine

## 2023-04-06 VITALS — BP 128/70 | Ht 69.0 in | Wt 192.0 lb

## 2023-04-06 DIAGNOSIS — M25552 Pain in left hip: Secondary | ICD-10-CM

## 2023-04-06 DIAGNOSIS — M2170 Unequal limb length (acquired), unspecified site: Secondary | ICD-10-CM

## 2023-04-06 DIAGNOSIS — M533 Sacrococcygeal disorders, not elsewhere classified: Secondary | ICD-10-CM | POA: Diagnosis not present

## 2023-04-06 NOTE — Progress Notes (Signed)
PCP: Wanda Plump, MD  Chief Complaint: Left hip pain,   Subjective:   HPI: Patient is a 62 y.o. male here for left sided hip pain and pain while walking. Patient had THA a few years ago and states that he has been having some pain that comes and goes 3-4 days of the week over the past few years. Patient states that he has had to change his walk due to the pain. Patient notes the pain is located on his anterior hip area and also starts in his SI joint area. Patient did have a subtrochanteric injection on the left side 2 weeks ago and notes minimal relief.  Past Medical History:  Diagnosis Date   Adhesive capsulitis of right shoulder 06/06/2015   Allergic rhinitis    Diabetes (HCC)    GERD (gastroesophageal reflux disease)    Internal hemorrhoid    by colonoscopy 2011    Current Outpatient Medications on File Prior to Visit  Medication Sig Dispense Refill   albuterol (VENTOLIN HFA) 108 (90 Base) MCG/ACT inhaler Inhale 2 puffs into the lungs every 6 (six) hours as needed for wheezing or shortness of breath. (Patient not taking: Reported on 07/20/2022) 8.5 g 5   empagliflozin (JARDIANCE) 10 MG TABS tablet Take 1 tablet (10 mg total) by mouth daily before breakfast. 30 tablet 4   empagliflozin (JARDIANCE) 10 MG TABS tablet Take 1 tablet (10 mg total) by mouth daily before breakfast. 21 tablet 0   Evolocumab (REPATHA SURECLICK) 140 MG/ML SOAJ Inject 1 mL into the skin every 14 (fourteen) days. 6 mL 1   loratadine (CLARITIN) 10 MG tablet Take 10 mg by mouth daily as needed for allergies.     meloxicam (MOBIC) 15 MG tablet Take 1 tablet (15 mg total) by mouth daily as needed for pain. 30 tablet 3   metFORMIN (GLUCOPHAGE) 500 MG tablet Take 1 tablet (500 mg total) by mouth 2 (two) times daily with a meal. 180 tablet 1   pantoprazole (PROTONIX) 40 MG tablet Take 1 tablet (40 mg total) by mouth daily before breakfast. 90 tablet 1   No current facility-administered medications on file prior to visit.     Past Surgical History:  Procedure Laterality Date   EYE SURGERY     x 2   HIP SURGERY Left    labrum repair , arthroscopy   SHOULDER ARTHROSCOPY Right 06/06/2015   Procedure: RIGHT SHOULDER ARTHROSCOPY WITH EXTENSIVE LABRAL DEBRIDEMENT AND MANIPULATION;  Surgeon: Teryl Lucy, MD;  Location: [facility];  Service: Orthopedics;  Laterality: Right;   TOTAL HIP ARTHROPLASTY Left 08/15/2020   Procedure: LEFT TOTAL HIP ARTHROPLASTY ANTERIOR APPROACH;  Surgeon: Kathryne Hitch, MD;  Location: [facility];  Service: Orthopedics;  Laterality: Left;    Allergies  Allergen Reactions   Flomax [Tamsulosin Hcl] Shortness Of Breath   Statins Other (See Comments)    myalgias    BP 128/70   Ht 5\' 9"  (1.753 m)   Wt 192 lb (87.1 kg)   BMI 28.35 kg/m       No data to display              No data to display              Objective:  Physical Exam:  Gen: NAD, comfortable in exam room  Hip, Left:  No obvious rash, erythema, ecchymosis, or edema. Passive Log Roll equivalent b/l without restriction. ROM full in all directions; Strength 5/5 in IR/ER/Ext/Abd/Add, patient  has noted weakness and pain with hip flexion 4/5 strength. Greater trochanter with mild tenderness to palpation. No tenderness over piriformis. No SI joint tenderness and left SI joint is noted to have minimal movement compared to right. The left hip has approx 1.5 cm difference shorter compared to the   Provocative Testing:    - FABER/FADIR test: NEG   - Ober's test: NEG    - Thomas test: NEG   - Trendelenburg test: Left hip drop   - Ely's test: NEG   - Hop test: NEG  Ankle/Foot, Bilateral:  There is noted loss of the right transverse arch at the tarsal metatarsal junction. There is bilateral pes planus noted. There is splaying of the first great toe on both feet with the R>L. There is hammering of the 2nd and 3rd toe on the left foot. There is noted loss of transverse arch on the left side though  not as significant as left.     Assessment & Plan:  1. Lower limb length difference - Patient presenting with trendelenburg gait and hip pain likely related to his limb length difference. Patient also has some hip flexor pain and weakness. At this time, will provide patient with green sports insoles with a lateral wedge inserted onto the left side. Patient had appropriate gait after trying new insoles Patients also noted to have left sided hip flexor weakness. Will give patient hip flexor exercises and have him follow up in approx 8 weeks. If patient has relief from green insoles, can consider making orthotics for patient as they will last longer.    2.SI Joint dysfunction - Patient also noted to have little to zero mobility of his left sided SI joint. Patient given specific stretching exercises in order to increase the mobility of that side. He was also advised to apply heat over the affected SI joint.  Will reevaluate in 8 weeks.   3. Left hip pain  Brenton Grills MD, PGY-4  Sports Medicine Fellow [facility]

## 2023-04-18 ENCOUNTER — Encounter: Payer: Commercial Managed Care - PPO | Admitting: Physical Therapy

## 2023-04-18 ENCOUNTER — Ambulatory Visit (HOSPITAL_COMMUNITY): Payer: Commercial Managed Care - PPO

## 2023-05-03 ENCOUNTER — Encounter: Payer: Commercial Managed Care - PPO | Admitting: Family Medicine

## 2023-05-19 NOTE — Addendum Note (Signed)
Addended by: Annita Brod on: 05/19/2023 03:16 PM   Modules accepted: Orders

## 2023-05-31 ENCOUNTER — Other Ambulatory Visit: Payer: Self-pay

## 2023-05-31 ENCOUNTER — Encounter: Payer: Self-pay | Admitting: Internal Medicine

## 2023-05-31 ENCOUNTER — Other Ambulatory Visit (HOSPITAL_COMMUNITY): Payer: Self-pay

## 2023-05-31 MED ORDER — EMPAGLIFLOZIN 10 MG PO TABS
10.0000 mg | ORAL_TABLET | Freq: Every day | ORAL | 0 refills | Status: DC
  Filled 2023-05-31: qty 90, 90d supply, fill #0

## 2023-06-02 ENCOUNTER — Other Ambulatory Visit (HOSPITAL_COMMUNITY): Payer: Self-pay

## 2023-06-02 ENCOUNTER — Ambulatory Visit (INDEPENDENT_AMBULATORY_CARE_PROVIDER_SITE_OTHER): Payer: Commercial Managed Care - PPO | Admitting: Sports Medicine

## 2023-06-02 VITALS — BP 120/82 | Ht 69.0 in | Wt 188.0 lb

## 2023-06-02 DIAGNOSIS — M25552 Pain in left hip: Secondary | ICD-10-CM

## 2023-06-02 DIAGNOSIS — M2170 Unequal limb length (acquired), unspecified site: Secondary | ICD-10-CM | POA: Diagnosis not present

## 2023-06-02 NOTE — Progress Notes (Signed)
PCP: Wanda Plump, MD  SUBJECTIVE:   HPI:  Patient is a 62 y.o. male here for follow-up of his left hip pain and custom orthotics. He has h/o left THA back in 2022. He had sports insoles with a 3/4" medial heel wedge made at his last visit and states these have helped him some, though is having persistent left anterolateral hip pain and tightness. He has been doing his stretches and hip abductor strengthening exercises as well. Has previously received a trochanteric bursa injection without relief.  ROS:     See HPI  PERTINENT  PMH / PSH FH / / SH:  Past Medical, Surgical, Social, and Family History Reviewed & Updated in the EMR.  Pertinent findings include:  Left Hip OA s/p THA 2022  Allergies  Allergen Reactions   Flomax [Tamsulosin Hcl] Shortness Of Breath   Statins Other (See Comments)    myalgias   OBJECTIVE:  BP 120/82   Ht 5\' 9"  (1.753 m)   Wt 188 lb (85.3 kg)   BMI 27.76 kg/m   PHYSICAL EXAM:  GEN: Alert and Oriented, NAD, comfortable in exam room RESP: Unlabored respirations, symmetric chest rise PSY: normal mood, congruent affect   MSK EXAM: Left Hip: Left anterolateral hip musculature is more prominent of the left lateral to his previous incision site. No significant swelling or overlaying skin changes. Full range of motion with 3+/5 strength on hip flexion, 4+/5 strength on hip abduction. TTP over distal glute med tendon, ASIS, and TFL. Tightness of IT band. Not significantly tender over greater trochanter or SI joint.  Neurovascularly intact distally. Leg length discrepancy of 1cm R>L.   Feet: Loss of transverse arch bilaterally. Splaying of 1st-2nd toes on right. Neutral flat foot with 1st ray dominance bilaterally. Normal posterior tibialis function with heel raise bilaterally.  Gait analysis: Trendelenburg gait on the left, fully corrected with 3/4" medial heel wedge  Assessment & Plan Left hip pain Persistent pain and tightness of his anterolateral  left hip. He has increased strength of his hip abductors, though still some deficiency. Most of his persistent weakness is related to his left hip flexors which I think are major drivers of his pain. We did make custom orthotics today as below to correct some of the contribution of his leg length discrepancy to his hip pain as it is likely mechanical in nature though some contribution from scar tissue related to his hip replacement. Encouraged to continue his hip strengthening exercises and start to incorporate a bit more weight on these. Also performed a trial of ECSWT of the left hip as below.   Procedure: ECSWT Indications:  Left hip pain   Procedure Details Consent: Risks of procedure as well as the alternatives and risks of each were explained to the patient.  Written consent for procedure obtained. Time Out: Verified patient identification, verified procedure, site was marked, verified correct patient position, medications/allergies/relevent history reviewed.  The area was cleaned with alcohol swab.     The left lateral hip was targeted for Extracorporeal shockwave therapy.    Preset: chronic Greater trochanteric pain Power Level: 120 Frequency: 10 Impulse/cycles: 2000 Head size: large  This was applied to the area of spasmed muscle along his lateral hip just lateral to his prior surgical scar   Patient tolerated procedure well without immediate complications He felt some improvement in motion afterwards   Leg length discrepancy Patient was fitted for a: standard, cushioned, semi-rigid orthotic. The orthotic was heated and afterward the  patient stood on the orthotic blank positioned on the orthotic stand. The patient was positioned in subtalar neutral position and 10 degrees of ankle dorsiflexion in a weight bearing stance on the heated orthotic blank. After completion of molding, a stable base was applied to the orthotic blank. The blank was ground to a stable position for weight  bearing. Size: 12  Base: Fit & Run Posting: None Additional orthotic padding: 3/4" medial heel wedge   Glean Salen, MD PGY-4, Sports Medicine Fellow [facility]  I observed and examined the patient with the resident and agree with assessment and plan.  Note reviewed and modified by me.  I performed the ESWT and supervised the orthotic preparation.  Sterling Big, MD

## 2023-06-06 ENCOUNTER — Encounter: Payer: Self-pay | Admitting: Cardiovascular Disease

## 2023-06-07 NOTE — Telephone Encounter (Signed)
error 

## 2023-06-20 ENCOUNTER — Encounter: Payer: Self-pay | Admitting: Family Medicine

## 2023-06-20 ENCOUNTER — Ambulatory Visit (INDEPENDENT_AMBULATORY_CARE_PROVIDER_SITE_OTHER): Payer: Self-pay | Admitting: Family Medicine

## 2023-06-20 DIAGNOSIS — M25552 Pain in left hip: Secondary | ICD-10-CM

## 2023-06-20 NOTE — Progress Notes (Signed)
 Patient returned today for shockwave therapy of his left hip. Not much change yet in his symptoms between this and lift/orthotics.  Procedure: ECSWT Indications:  right hip pain   Procedure Details Consent: Risks of procedure as well as the alternatives and risks of each were explained to the patient.  Written consent obtained including risk of prosthesis loosening. Time Out: Verified patient identification, verified procedure, site was marked, verified correct patient position, medications/allergies/relevent history reviewed.  The area was cleaned with alcohol swab.     The right lateral hip was targeted for Extracorporeal shockwave therapy.    Preset: trochanteric bursitis Power Level: 120 Frequency: 14 Impulse/cycles: 2000 Head size: large   Patient tolerated procedure well without immediate complications.

## 2023-07-01 ENCOUNTER — Ambulatory Visit (INDEPENDENT_AMBULATORY_CARE_PROVIDER_SITE_OTHER): Payer: Self-pay | Admitting: Family Medicine

## 2023-07-01 ENCOUNTER — Encounter: Payer: Self-pay | Admitting: Family Medicine

## 2023-07-01 DIAGNOSIS — M7062 Trochanteric bursitis, left hip: Secondary | ICD-10-CM

## 2023-07-01 DIAGNOSIS — M7612 Psoas tendinitis, left hip: Secondary | ICD-10-CM

## 2023-07-01 NOTE — Progress Notes (Incomplete)
Procedure: ECSWT Indications:  ***   Procedure Details Consent: Risks of procedure as well as the alternatives and risks of each were explained to the patient.  Verbal consent for procedure obtained. Time Out: Verified patient identification, verified procedure, site was marked, verified correct patient position. The area was cleaned with alcohol swab.     The *** was targeted for Extracorporeal shockwave therapy.    Preset: *** Power Level: *** Frequency: *** Impulse/cycles: *** Head size: Regular***   Patient tolerated procedure well without immediate complications.

## 2023-07-04 NOTE — Patient Instructions (Signed)

## 2023-07-08 ENCOUNTER — Ambulatory Visit (HOSPITAL_BASED_OUTPATIENT_CLINIC_OR_DEPARTMENT_OTHER): Payer: Commercial Managed Care - PPO | Admitting: Orthopaedic Surgery

## 2023-07-08 DIAGNOSIS — Z96642 Presence of left artificial hip joint: Secondary | ICD-10-CM | POA: Diagnosis not present

## 2023-07-08 DIAGNOSIS — M25552 Pain in left hip: Secondary | ICD-10-CM

## 2023-07-08 MED ORDER — LIDOCAINE HCL 1 % IJ SOLN
4.0000 mL | INTRAMUSCULAR | Status: AC | PRN
Start: 2023-07-08 — End: 2023-07-08
  Administered 2023-07-08: 4 mL

## 2023-07-08 MED ORDER — TRIAMCINOLONE ACETONIDE 40 MG/ML IJ SUSP
80.0000 mg | INTRAMUSCULAR | Status: AC | PRN
Start: 2023-07-08 — End: 2023-07-08
  Administered 2023-07-08: 80 mg via INTRA_ARTICULAR

## 2023-07-08 NOTE — Progress Notes (Signed)
Chief Complaint: Left hip pain     History of Present Illness:    Terry Stade, Terry Benson is a 63 y.o. male presents today with ongoing left hip flexor type pain.  He is status post left total hip arthroplasty with Dr. Magnus Ivan in 2022.  Since that time he states he has had a fullness in the crease of the left groin.  He has previously had an injection as well as shockwave therapy with Dr. Magnus Ivan.  He is not getting any relief from his shockwave therapy for the abductor mechanism.  He feels weakness in the hip.  He has been taking Mobic 4-5 times a week but is ultimately limping at the end of a long day.  He works at [employer] as a Development worker, international aid.  He is having a difficult time with activity at this point    PMH/PSH/Family History/Social History/Meds/Allergies:    Past Medical History:  Diagnosis Date  . Adhesive capsulitis of right shoulder 06/06/2015  . Allergic rhinitis   . Diabetes (HCC)   . GERD (gastroesophageal reflux disease)   . Internal hemorrhoid    by colonoscopy 2011   Past Surgical History:  Procedure Laterality Date  . EYE SURGERY     x 2  . HIP SURGERY Left    labrum repair , arthroscopy  . SHOULDER ARTHROSCOPY Right 06/06/2015   Procedure: RIGHT SHOULDER ARTHROSCOPY WITH EXTENSIVE LABRAL DEBRIDEMENT AND MANIPULATION;  Surgeon: Teryl Lucy, Terry Benson;  Location: [facility];  Service: Orthopedics;  Laterality: Right;  . TOTAL HIP ARTHROPLASTY Left 08/15/2020   Procedure: LEFT TOTAL HIP ARTHROPLASTY ANTERIOR APPROACH;  Surgeon: Kathryne Hitch, Terry Benson;  Location: [facility];  Service: Orthopedics;  Laterality: Left;   Social History   Socioeconomic History  . Marital status: Married    Spouse name: Not on file  . Number of children: Not on file  . Years of education: Not on file  . Highest education level: Not on file  Occupational History  . Occupation: Terry Benson  Tobacco Use  . Smoking status: Never  . Smokeless tobacco: Former  . Tobacco comments:    chews    Vaping Use  . Vaping status: Never Used  Substance and Sexual Activity  . Alcohol use: Yes    Comment: occas  . Drug use: No  . Sexual activity: Yes    Partners: Female  Other Topics Concern  . 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   -Daughter in college, physical therapy?.    Rides motorcycle for leisure - aware of risks.   Social Drivers of Corporate investment banker Strain: Not on file  Food Insecurity: Not on file  Transportation Needs: Not on file  Physical Activity: Not on file  Stress: Not on file  Social Connections: Not on file   Family History  Problem Relation Age of Onset  . Diabetes Father   . CAD Father 83  . Lung cancer Father 51       smoker  . Cancer Maternal Grandfather        colon  . Cancer Paternal Aunt        colon  . Prostate cancer Neg Hx    Allergies  Allergen Reactions  . Flomax [Tamsulosin Hcl] Shortness Of Breath  . Statins Other (See Comments)    myalgias   Current Outpatient Medications  Medication  Sig Dispense Refill  . albuterol (VENTOLIN HFA) 108 (90 Base) MCG/ACT inhaler Inhale 2 puffs into the lungs every 6 (six) hours as needed for wheezing or shortness of breath. (Patient not taking: Reported on 07/20/2022) 8.5 g 5  . empagliflozin (JARDIANCE) 10 MG TABS tablet Take 1 tablet (10 mg total) by mouth daily before breakfast. 90 tablet 0  . Evolocumab (REPATHA SURECLICK) 140 MG/ML SOAJ Inject 1 mL into the skin every 14 (fourteen) days. 6 mL 1  . loratadine (CLARITIN) 10 MG tablet Take 10 mg by mouth daily as needed for allergies.    . meloxicam (MOBIC) 15 MG tablet Take 1 tablet (15 mg total) by mouth daily as needed for pain. 30 tablet 3  . metFORMIN (GLUCOPHAGE) 500 MG tablet Take 1 tablet (500 mg total) by mouth 2 (two) times daily with a meal. 180 tablet 1  . pantoprazole (PROTONIX) 40 MG tablet Take 1 tablet (40 mg total) by  mouth daily before breakfast. 90 tablet 1   No current facility-administered medications for this visit.   No results found.  Review of Systems:   A ROS was performed including pertinent positives and negatives as documented in the HPI.  Physical Exam :   Constitutional: NAD and appears stated age Neurological: Alert and oriented Psych: Appropriate affect and cooperative There were no vitals taken for this visit.   Comprehensive Musculoskeletal Exam:    Left hip incision is well-appearing without erythema or drainage.  He has 30 degrees internal/external rotation of the left hip without pain.  He does have weakness with getting up from a chair on the left side.  He does have weakness with resisted flexion of the left hip.  Distal neurosensory exam is intact.  There is fullness about the left hip crease and abductor mechanism.   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  There is some heterotopic ossification involving the abductor mechanism.  The psoas does appear to be running over the acetabular component with fluid around it but no obvious impingement  I personally reviewed and interpreted the radiographs.   Assessment and Plan:   63 y.o. male with ongoing left hip pain.  He is status post left hip labral debridement with Dr. Merlyn Albert 20 years prior.  He subsequently did well for many years and has status post left total hip arthroplasty done with Dr. Magnus Ivan in 2022.  At today's visit he is complaining predominantly of limited strength particular with a single-leg step up.  He is having pain deep in the groin area.  I did discuss that given the fact that he is not having significant improvement or changes with the shock therapy involving the abductor mechanism that some he has symptoms point more towards the iliopsoas  muscle.  I did recommend initial early ultrasound-guided injection of the iliopsoas as his MRI does show evidence of irritation next to the acetabular component.  Ultimately I do believe that the acetabular component is well-positioned although I did describe that patients can get iliopsoas symptoms even in the setting of a well-positioned component.  At this point we will plan for an initial diagnostic ultrasound-guided injection of iliopsoas and he will send me a message shortly with how he is feeling over the weekend  -Left hip ultrasound-guided injection provided verbal consent obtained    Procedure Note  Patient: Terry Stade, Terry Benson             Date of Birth: 1961/04/28           MRN: [id number]             Visit Date: 07/08/2023  Procedures: Visit Diagnoses: No diagnosis found.  Large Joint Inj: L hip joint on 07/08/2023 3:58 PM Indications: pain Details: 22 G 3.5 in needle, ultrasound-guided anterolateral approach  Arthrogram: No  Medications: 4 mL lidocaine 1 %; 80 mg triamcinolone acetonide 40 MG/ML Outcome: tolerated well, no immediate complications Procedure, treatment alternatives, risks and benefits explained, specific risks discussed. Consent was given by the patient. Immediately prior to procedure a time out was called to verify the correct patient, procedure, equipment, support staff and site/side marked as required. Patient was prepped and draped in the usual sterile fashion.       I personally saw and evaluated the patient, and participated in the management and treatment plan.  Huel Cote, Terry Benson Attending Physician, Orthopedic Surgery  This document was dictated using Dragon voice recognition software. A reasonable attempt at proof reading has been made to minimize errors.

## 2023-07-27 ENCOUNTER — Ambulatory Visit (HOSPITAL_BASED_OUTPATIENT_CLINIC_OR_DEPARTMENT_OTHER): Payer: Commercial Managed Care - PPO | Admitting: Orthopaedic Surgery

## 2023-07-27 DIAGNOSIS — M7072 Other bursitis of hip, left hip: Secondary | ICD-10-CM

## 2023-07-27 NOTE — Progress Notes (Signed)
Chief Complaint: Left hip pain     History of Present Illness:   07/27/2023: Today for follow-up discussion of the left hip.  He did get approximately 3 days of 90% or greater relief from his injection.  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  He is status post left total hip arthroplasty with Dr. Magnus Ivan in 2022.  Since that time he states he has had a fullness in the crease of the left groin.  He has previously had an injection as well as shockwave therapy with Dr. Magnus Ivan.  He is not getting any relief from his shockwave therapy for the abductor mechanism.  He feels weakness in the hip.  He has been taking Mobic 4-5 times a week but is ultimately limping at the end of a long day.  He works at [employer] as a Development worker, international aid.  He is having a difficult time with activity at this point    PMH/PSH/Family History/Social History/Meds/Allergies:    Past Medical History:  Diagnosis Date   Adhesive capsulitis of right shoulder 06/06/2015   Allergic rhinitis    Diabetes (HCC)    GERD (gastroesophageal reflux disease)    Internal hemorrhoid    by colonoscopy 2011   Past Surgical History:  Procedure Laterality Date   EYE SURGERY     x 2   HIP SURGERY Left    labrum repair , arthroscopy   SHOULDER ARTHROSCOPY Right 06/06/2015   Procedure: RIGHT SHOULDER ARTHROSCOPY WITH EXTENSIVE LABRAL DEBRIDEMENT AND MANIPULATION;  Surgeon: Teryl Lucy, MD;  Location: [facility];  Service: Orthopedics;  Laterality: Right;   TOTAL HIP ARTHROPLASTY Left 08/15/2020   Procedure: LEFT TOTAL HIP ARTHROPLASTY ANTERIOR APPROACH;  Surgeon: Kathryne Hitch, MD;  Location: [facility];  Service: Orthopedics;  Laterality: Left;   Social History   Socioeconomic History   Marital status: Married    Spouse name: Not on file   Number of children: Not on file   Years of education: Not on file   Highest education level:  Not on file  Occupational History   Occupation: MD  Tobacco Use   Smoking status: Never   Smokeless tobacco: Former   Tobacco comments:    chews   Vaping Use   Vaping status: Never Used  Substance and Sexual Activity   Alcohol use: Yes    Comment: occas   Drug use: No   Sexual activity: Yes    Partners: Female  Other Topics Concern   Not on file  Social History Narrative   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   -Daughter in college, physical therapy?.    Rides motorcycle for leisure - aware of risks.   Social Drivers of Corporate investment banker Strain: Not on file  Food Insecurity: Not on file  Transportation Needs: Not on file  Physical Activity: Not on file  Stress: Not on file  Social Connections: Not on file   Family History  Problem Relation Age of Onset   Diabetes Father    CAD Father 36   Lung cancer Father 60       smoker   Cancer Maternal Grandfather        colon   Cancer Paternal Aunt        colon   Prostate cancer Neg Hx  Allergies  Allergen Reactions   Flomax [Tamsulosin Hcl] Shortness Of Breath   Statins Other (See Comments)    myalgias   Current Outpatient Medications  Medication Sig Dispense Refill   albuterol (VENTOLIN HFA) 108 (90 Base) MCG/ACT inhaler Inhale 2 puffs into the lungs every 6 (six) hours as needed for wheezing or shortness of breath. (Patient not taking: Reported on 07/20/2022) 8.5 g 5   empagliflozin (JARDIANCE) 10 MG TABS tablet Take 1 tablet (10 mg total) by mouth daily before breakfast. 90 tablet 0   Evolocumab (REPATHA SURECLICK) 140 MG/ML SOAJ Inject 1 mL into the skin every 14 (fourteen) days. 6 mL 1   loratadine (CLARITIN) 10 MG tablet Take 10 mg by mouth daily as needed for allergies.     meloxicam (MOBIC) 15 MG tablet Take 1 tablet (15 mg total) by mouth daily as needed for pain. 30 tablet 3   metFORMIN (GLUCOPHAGE) 500 MG tablet Take 1 tablet (500 mg  total) by mouth 2 (two) times daily with a meal. 180 tablet 1   pantoprazole (PROTONIX) 40 MG tablet Take 1 tablet (40 mg total) by mouth daily before breakfast. 90 tablet 1   No current facility-administered medications for this visit.   No results found.  Review of Systems:   A ROS was performed including pertinent positives and negatives as documented in the HPI.  Physical Exam :   Constitutional: NAD and appears stated age Neurological: Alert and oriented Psych: Appropriate affect and cooperative There were no vitals taken for this visit.   Comprehensive Musculoskeletal Exam:    Left hip incision is well-appearing without erythema or drainage.  He has 30 degrees internal/external rotation of the left hip without pain.  He does have weakness with getting up from a chair on the left side.  He does have weakness with resisted flexion of the left hip.  Distal neurosensory exam is intact.  There is fullness about the left hip crease and abductor mechanism.   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  There is some heterotopic ossification involving the abductor mechanism.  The psoas does appear to be running over the acetabular component with fluid around it but no obvious impingement  I personally reviewed and interpreted the radiographs.   Assessment and Plan:   63 y.o. male with ongoing left hip pain.  He is status post left hip labral debridement with Dr. Merlyn Albert 20 years prior.  He subsequently did well for many years and has status post left total hip arthroplasty done with Dr. Magnus Ivan in 2022.  At today's visit he is complaining predominantly of limited strength particular with a single-leg step up.  He is having pain deep in the groin area.  I did discuss that given the fact that he is not having significant  improvement or changes with the shock therapy involving the abductor mechanism that some he has symptoms point more towards the iliopsoas muscle.  I did recommend initial early ultrasound-guided injection of the iliopsoas as his MRI does show evidence of irritation next to the acetabular component.  Ultimately I do believe that the acetabular component is well-positioned although I did describe that patients can get iliopsoas symptoms even in the setting of a well-positioned component.  I did discuss that he does have increased offset on this side compared to the contralateral  side.  I do believe that may be creating some tension on the glutes as well as the iliopsoas.  This will describe why there is iliopsoas irritation despite overall position acetabular component.  Ultimately the would like to potentially undergo a second diagnostic injection should this flareup again.  He will plan to consult with Dr. Magnus Ivan as well for discussion of this time although is hesitant to consider any type of revision arthroplasty  -Return to clinic for possible additional diagnostic injection      I personally saw and evaluated the patient, and participated in the management and treatment plan.  Huel Cote, MD Attending Physician, Orthopedic Surgery  This document was dictated using Dragon voice recognition software. A reasonable attempt at proof reading has been made to minimize errors.

## 2023-08-11 ENCOUNTER — Encounter: Payer: Self-pay | Admitting: Orthopaedic Surgery

## 2023-08-12 ENCOUNTER — Ambulatory Visit (INDEPENDENT_AMBULATORY_CARE_PROVIDER_SITE_OTHER): Payer: Commercial Managed Care - PPO | Admitting: Internal Medicine

## 2023-08-12 ENCOUNTER — Encounter: Payer: Self-pay | Admitting: Internal Medicine

## 2023-08-12 VITALS — BP 126/82 | HR 76 | Ht 69.0 in | Wt 185.2 lb

## 2023-08-12 DIAGNOSIS — E785 Hyperlipidemia, unspecified: Secondary | ICD-10-CM | POA: Diagnosis not present

## 2023-08-12 DIAGNOSIS — Z7984 Long term (current) use of oral hypoglycemic drugs: Secondary | ICD-10-CM | POA: Diagnosis not present

## 2023-08-12 DIAGNOSIS — E119 Type 2 diabetes mellitus without complications: Secondary | ICD-10-CM | POA: Diagnosis not present

## 2023-08-12 DIAGNOSIS — E038 Other specified hypothyroidism: Secondary | ICD-10-CM

## 2023-08-12 DIAGNOSIS — Z0000 Encounter for general adult medical examination without abnormal findings: Secondary | ICD-10-CM

## 2023-08-12 NOTE — Patient Instructions (Signed)
 Please contact GI regards to future endoscopy   Check the  blood pressure regularly Blood pressure goal:  between 110/65 and  135/85. If it is consistently higher or lower, let me know   Diabetes: You can check your sugars at different times, they right times to do it are: - early in AM fasting  ( blood sugar goal 70-130) - 2 hours after a meal (blood sugar goal less than 180)     GO TO THE LAB : Get the blood work     Please go to the front desk and schedule the following: Physical exam in 1 year       "Health Care Power of attorney" ,  "Living will" (Advance care planning documents)  If you already have a living will or healthcare power of attorney, is recommended you bring the copy to be scanned in your chart.   The document will be available to all the doctors you see in the system.  Advance care planning is a process that supports adults in  understanding and sharing their preferences regarding future medical care.  The patient's preferences are recorded in documents called Advance Directives and the can be modified at any time while the patient is in full mental capacity.   If you don't have one, please consider create one.      More information at: [email or web address]

## 2023-08-12 NOTE — Progress Notes (Signed)
 Subjective:    Patient ID: Terry Stade, MD, male    DOB: [birth year], 63 y.o.   MRN: [id number]  DOS:  08/12/2023 Type of visit - description: Here for CPX  Other than hip pain, he feels great, working out, he is losing weight.   Review of Systems  Other than above, a 14 point review of systems is negative      Past Medical History:  Diagnosis Date   Adhesive capsulitis of right shoulder 06/06/2015   Allergic rhinitis    Diabetes (HCC)    GERD (gastroesophageal reflux disease)    Internal hemorrhoid    by colonoscopy 2011    Past Surgical History:  Procedure Laterality Date   EYE SURGERY     x 2   HIP SURGERY Left    labrum repair , arthroscopy   SHOULDER ARTHROSCOPY Right 06/06/2015   Procedure: RIGHT SHOULDER ARTHROSCOPY WITH EXTENSIVE LABRAL DEBRIDEMENT AND MANIPULATION;  Surgeon: Teryl Lucy, MD;  Location: [facility];  Service: Orthopedics;  Laterality: Right;   TOTAL HIP ARTHROPLASTY Left 08/15/2020   Procedure: LEFT TOTAL HIP ARTHROPLASTY ANTERIOR APPROACH;  Surgeon: Kathryne Hitch, MD;  Location: [facility];  Service: Orthopedics;  Laterality: Left;   Social History   Socioeconomic History   Marital status: Married    Spouse name: Not on file   Number of children: Not on file   Years of education: Not on file   Highest education level: Professional school degree (e.g., MD, DDS, DVM, JD)  Occupational History   Occupation: MD  Tobacco Use   Smoking status: Never   Smokeless tobacco: Former   Tobacco comments:    chews   Vaping Use   Vaping status: Never Used  Substance and Sexual Activity   Alcohol use: Yes    Comment: occas   Drug use: No   Sexual activity: Yes    Partners: Female  Other Topics Concern   Not on file  Social History Narrative   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    Social Drivers of Corporate investment banker Strain: Low Risk  (08/11/2023)   Overall Financial Resource Strain (CARDIA)    Difficulty of Paying Living Expenses: Not hard at all  Food Insecurity: No Food Insecurity (08/11/2023)   Hunger Vital Sign    Worried About Running Out of Food in the Last Year: Never true    Ran Out of Food in the Last Year: Never true  Transportation Needs: No Transportation Needs (08/11/2023)   PRAPARE - Administrator, Civil Service (Medical): No    Lack of Transportation (Non-Medical): No  Physical Activity: Sufficiently Active (08/11/2023)   Exercise Vital Sign    Days of Exercise per Week: 5 days    Minutes of Exercise per Session: 60 min  Stress: No Stress Concern Present (08/11/2023)   [employer] of Occupational Health - Occupational Stress Questionnaire    Feeling of Stress : Not at all  Social Connections: Socially Integrated (08/11/2023)   Social Connection and Isolation Panel [NHANES]    Frequency of Communication with Friends and Family: More than three times a week    Frequency of Social Gatherings with Friends and Family: Twice a week    Attends Religious Services: 1 to 4 times per year    Active  Member of Clubs or Organizations: Yes    Attends Banker Meetings: 1 to 4 times per year    Marital Status: Married  Catering manager Violence: Not on file    Current Outpatient Medications  Medication Instructions   albuterol (VENTOLIN HFA) 108 (90 Base) MCG/ACT inhaler 2 puffs, Inhalation, Every 6 hours PRN   Evolocumab (REPATHA SURECLICK) 140 MG/ML SOAJ Inject 1 mL into the skin every 14 (fourteen) days.   Jardiance 10 mg, Oral, Daily before breakfast   loratadine (CLARITIN) 10 mg, Daily PRN   meloxicam (MOBIC) 15 mg, Oral, Daily PRN   metFORMIN (GLUCOPHAGE) 500 mg, Oral, 2 times daily with meals   pantoprazole (PROTONIX) 40 mg, Oral, Daily before breakfast       Objective:   Physical Exam BP 126/82 (BP  Location: Left Arm, Patient Position: Sitting, Cuff Size: Normal)   Pulse 76   Ht 5\' 9"  (1.753 m)   Wt 185 lb 3.2 oz (84 kg)   SpO2 96%   BMI 27.35 kg/m  General: Well developed, NAD, BMI noted Neck: No  thyromegaly  HEENT:  Normocephalic . Face symmetric, atraumatic Lungs:  CTA B Normal respiratory effort, no intercostal retractions, no accessory muscle use. Heart: RRR,  no murmur.  Abdomen:  Not distended, soft, non-tender. No rebound or rigidity.   DM foot exam: Pinprick examination normal, good pedal pulses. Skin: Exposed areas without rash. Not pale. Not jaundice Neurologic:  alert & oriented X3.  Speech normal, gait appropriate for age and unassisted Strength symmetric and appropriate for age.  Psych: Cognition and judgment appear intact.  Cooperative with normal attention span and concentration.  Behavior appropriate. No anxious or depressed appearing.     Assessment     Assessment Mild DM:  A1c 6.5 ---2009 Dyslipidemia Allergies and RAD + FH CAD:has  a coronary calcium score every 3 years GERD: Multiple EGDs, 07/25/1997 esoph biopsy: Esophagitis, no Barrett's. 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  cologuard (-) 10/2019., (-) cologuard 6/ 2024  --Prostate ca screening: No symptoms, check PSA --Diet and exercise: Doing great -- Still chews some tobacco - labs: See orders - POA: Discussed and recommended We also discussed:  DM: On metformin, Jardiance.  CBG goals if he decides to check provided. Dyslipidemia: On Repatha, checking labs. Allergies and RAD: Very seldom has symptoms GERD: On pantoprazole, minimal symptoms, no dysphagia or odynophagia.  He wonders about the need to have another EGD, we agree he will reach out to GI. MSK: Still having  hip pain , f/u bu ortho. Pt  is managing to work out and be active. RTC 1 year

## 2023-08-14 ENCOUNTER — Encounter: Payer: Self-pay | Admitting: Internal Medicine

## 2023-08-14 NOTE — Assessment & Plan Note (Signed)
 Here for CPX We also discussed:  DM: On metformin, Jardiance.  CBG goals if he decides to check provided. Dyslipidemia: On Repatha, checking labs. Allergies and RAD: Very seldom has symptoms GERD: On pantoprazole, minimal symptoms, no dysphagia or odynophagia.  He wonders about the need to have another EGD, we agree he will reach out to GI. MSK: Still having hip pain , f/u bu ortho. Pt  is managing to work out and be active. RTC 1 year

## 2023-08-14 NOTE — Assessment & Plan Note (Signed)
 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  cologuard (-) 10/2019., (-) cologuard 6/ 2024  --Prostate ca screening: No symptoms, check PSA --Diet and exercise: Doing great -- Still chews some tobacco - labs: See orders - POA: Discussed and recommended

## 2023-08-15 LAB — COMPREHENSIVE METABOLIC PANEL
AG Ratio: 1.8 (calc) (ref 1.0–2.5)
ALT: 17 U/L (ref 9–46)
AST: 16 U/L (ref 10–35)
Albumin: 4.4 g/dL (ref 3.6–5.1)
Alkaline phosphatase (APISO): 61 U/L (ref 35–144)
BUN: 24 mg/dL (ref 7–25)
CO2: 26 mmol/L (ref 20–32)
Calcium: 9.2 mg/dL (ref 8.6–10.3)
Chloride: 103 mmol/L (ref 98–110)
Creat: 1.08 mg/dL (ref 0.70–1.35)
Globulin: 2.4 g/dL (ref 1.9–3.7)
Glucose, Bld: 95 mg/dL (ref 65–99)
Potassium: 4.3 mmol/L (ref 3.5–5.3)
Sodium: 139 mmol/L (ref 135–146)
Total Bilirubin: 0.6 mg/dL (ref 0.2–1.2)
Total Protein: 6.8 g/dL (ref 6.1–8.1)

## 2023-08-15 LAB — CBC WITH DIFFERENTIAL/PLATELET
Absolute Lymphocytes: 1544 {cells}/uL (ref 850–3900)
Absolute Monocytes: 372 {cells}/uL (ref 200–950)
Basophils Absolute: 13 {cells}/uL (ref 0–200)
Basophils Relative: 0.2 %
Eosinophils Absolute: 32 {cells}/uL (ref 15–500)
Eosinophils Relative: 0.5 %
HCT: 47 % (ref 38.5–50.0)
Hemoglobin: 15.2 g/dL (ref 13.2–17.1)
MCH: 28.5 pg (ref 27.0–33.0)
MCHC: 32.3 g/dL (ref 32.0–36.0)
MCV: 88.2 fL (ref 80.0–100.0)
MPV: 9 fL (ref 7.5–12.5)
Monocytes Relative: 5.9 %
Neutro Abs: 4341 {cells}/uL (ref 1500–7800)
Neutrophils Relative %: 68.9 %
Platelets: 279 10*3/uL (ref 140–400)
RBC: 5.33 10*6/uL (ref 4.20–5.80)
RDW: 13 % (ref 11.0–15.0)
Total Lymphocyte: 24.5 %
WBC: 6.3 10*3/uL (ref 3.8–10.8)

## 2023-08-15 LAB — LIPOPROTEIN A (LPA): Lipoprotein (a): 10 nmol/L (ref <75)

## 2023-08-15 LAB — LIPID PANEL
Cholesterol: 132 mg/dL (ref <200)
HDL: 45 mg/dL (ref >=40)
LDL Cholesterol (Calc): 68 mg/dL
Non-HDL Cholesterol (Calc): 87 mg/dL (ref <130)
Total CHOL/HDL Ratio: 2.9 (calc) (ref <5.0)
Triglycerides: 100 mg/dL (ref <150)

## 2023-08-15 LAB — HEMOGLOBIN A1C
Hgb A1c MFr Bld: 6.7 %{Hb} — ABNORMAL HIGH (ref <5.7)
Mean Plasma Glucose: 146 mg/dL
eAG (mmol/L): 8.1 mmol/L

## 2023-08-15 LAB — TSH: TSH: 2.52 m[IU]/L (ref 0.40–4.50)

## 2023-08-15 LAB — T4, FREE: Free T4: 1.2 ng/dL (ref 0.8–1.8)

## 2023-08-15 LAB — PSA: PSA: 1.04 ng/mL (ref <=4.00)

## 2023-09-15 ENCOUNTER — Other Ambulatory Visit: Payer: Self-pay | Admitting: Internal Medicine

## 2023-09-15 ENCOUNTER — Other Ambulatory Visit (HOSPITAL_COMMUNITY): Payer: Self-pay

## 2023-09-15 ENCOUNTER — Encounter: Payer: Self-pay | Admitting: Internal Medicine

## 2023-09-15 ENCOUNTER — Other Ambulatory Visit: Payer: Self-pay

## 2023-09-15 MED ORDER — METFORMIN HCL 500 MG PO TABS
500.0000 mg | ORAL_TABLET | Freq: Two times a day (BID) | ORAL | 1 refills | Status: DC
  Filled 2023-09-15: qty 180, 90d supply, fill #0
  Filled 2023-12-28: qty 180, 90d supply, fill #1

## 2023-09-15 MED ORDER — REPATHA SURECLICK 140 MG/ML ~~LOC~~ SOAJ
1.0000 mL | SUBCUTANEOUS | 1 refills | Status: DC
Start: 2023-09-15 — End: 2024-03-29
  Filled 2023-09-15: qty 6, 84d supply, fill #0
  Filled 2024-03-02: qty 6, 84d supply, fill #1

## 2023-09-15 MED ORDER — EMPAGLIFLOZIN 10 MG PO TABS
10.0000 mg | ORAL_TABLET | Freq: Every day | ORAL | 1 refills | Status: DC
  Filled 2023-09-15: qty 90, 90d supply, fill #0
  Filled 2023-12-28: qty 90, 90d supply, fill #1

## 2023-09-16 ENCOUNTER — Other Ambulatory Visit (HOSPITAL_COMMUNITY): Payer: Self-pay

## 2023-09-16 ENCOUNTER — Other Ambulatory Visit: Payer: Self-pay

## 2023-10-24 ENCOUNTER — Other Ambulatory Visit (INDEPENDENT_AMBULATORY_CARE_PROVIDER_SITE_OTHER): Payer: Self-pay

## 2023-10-24 ENCOUNTER — Ambulatory Visit: Admitting: Orthopaedic Surgery

## 2023-10-24 DIAGNOSIS — M25552 Pain in left hip: Secondary | ICD-10-CM | POA: Diagnosis not present

## 2023-10-24 NOTE — Progress Notes (Signed)
 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  We replaced his left hip back in March 2022 secondary to significant arthritis of the left hip.  This has been frustrating over the last year or so in terms of he has developed a limp and pain in the groin and inguinal area.  He was seen by Clyda Dark MD who felt that the limp was from a subtle leg length difference.  However he does not limp on a daily basis.  He did try a small heel lift but continued to have occasional groin pain and weakness with hip flexors as well as pain in in the limb.  He has had a remote injection around the left iliopsoas area.  I did obtain an MRI of his left hip back in September of last year and showed no abnormalities in terms of soft tissue collections or pathology around the iliopsoas tendon.  When I have him lay in a supine position with his toes pointing up I did not see there was a significant leg length difference at all.  His ASIS, patellas and medial malleolus were near same position.  Did not have him stand and when he stands he is putting equal pressure and weight on both hips.  He does have pain in the inguinal area just medial to believe the psoas tendon but is definitely reproducible and this is what hurts him the most.  There is subtle differences on inspection of the left side looking different than the right, just inspecting around the lateral aspect of the incision itself.  An AP pelvis shows no significant difference in his offset when comparing both sides and not a significant difference in leg length.  There may be just slight shortening but it is not of any significant consequence from my standpoint.  I would like to have him seen by Dr. Vaughn Georges in our office as a regular appointment to assess the left inguinal area under ultrasound and to give an opinion about what what he may think is going on in terms of the patient's gait, leg length and pain in the groin area.  This is frustrating for [name] and my goal is  to hopefully help him get better.  Once he is seen by Dr. Vaughn Georges I would like Dr. Vaughn Georges to get him back to me several weeks later.

## 2023-10-28 ENCOUNTER — Ambulatory Visit: Admitting: Sports Medicine

## 2023-10-28 ENCOUNTER — Encounter: Payer: Self-pay | Admitting: Sports Medicine

## 2023-10-28 ENCOUNTER — Other Ambulatory Visit: Payer: Self-pay

## 2023-10-28 DIAGNOSIS — M25552 Pain in left hip: Secondary | ICD-10-CM | POA: Diagnosis not present

## 2023-10-28 DIAGNOSIS — M76892 Other specified enthesopathies of left lower limb, excluding foot: Secondary | ICD-10-CM

## 2023-10-28 DIAGNOSIS — Z96642 Presence of left artificial hip joint: Secondary | ICD-10-CM | POA: Diagnosis not present

## 2023-10-28 DIAGNOSIS — G8929 Other chronic pain: Secondary | ICD-10-CM

## 2023-10-28 NOTE — Progress Notes (Signed)
 Patient has a long history of left hip pain and is frustrated as he has not gotten any answers. He says that most of his pain is medial to his scar, and over the inguinal ligament. He describes his worst pain with hip rotation and hip flexion. He does take Meloxicam  3-4 days a week with some relief, although not as much as he has gotten in the past. He did have a psoas injection in the past which gave him a couple of days of relief, but his pain did return. He has been stretching and doing home exercises with no relief.

## 2023-10-28 NOTE — Progress Notes (Signed)
 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  He is a friend of my partner, Dr. Lucienne Ryder who did do an anterior THA on 08/15/2020.  He had pain prior to his surgery from a degenerative labral tear.  Since the surgical however, he still continues with pain and some reported weakness with hip flexion.  He saw Dr. Hermina Loosen and did have a psoas injection which gave him good relief of his pain for about 3 days but then returned.  He also saw Clyda Dark, MD he felt the limp was from a leg length difference, he did try a heel lift for a few months which helped his back pain but did not improve his groin/hip flexor pain.  He also went a few treatments of extracorporeal shockwave therapy for the abductor and anterior hip without relief of his pain.  Brae tells me today that he notices a visual difference over the soft tissue of the anterior lateral hip as well.  He is very active and has lost weight over the past year or so but does have pain with hip flexion, leaning into the anterior hip and going from sitting to standing.  He does limp most days which is worse as the day goes on/with activity. Uses meloxicam  3-4 days a week with some relief.  He has saw multiple orthopedic and sports medicine opinions and is understandably frustrated with his continued pain and hip flexor weakness.  Pertinent ROS were reviewed with the patient and found to be negative unless otherwise specified above in HPI.   Assessment & Plan: Visit Diagnoses:  1. 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 Hip flexor tendinitis,  left    Plan: Impression is chronic anterior hip pain in the setting of prior THA.  He has pain and reported weakness with hip flexion that does cause a limp most days for him.  On x-ray and MRI I do not appreciate any evidence of hardware abnormality.  We did ultrasound the anterior hip today both static and dynamic visualization of the iliopsoas tendon which does not have snapping hip syndrome or impingement but does have associated pain and visualization of the iliopsoas rolling directly over the acetabular component.  There may be a mild degree of hypoechoic fluid in the sheath in this location, but certainly associated sono palpation.  He has strong hip abduction and did not get relief from shockwave therapy here or near his heterotopic ossification involving the abductors, so do not think this is contributing. There is definitely no leg length discrepancy on my exam today. His case is certainly complicated, but based on his exam as well as dynamic ultrasound, I do believe his pain is emanating from the iliopsoas. Through shared decision-making, we did proceed with an ultrasound-guided iliopsoas tendon sheath injection both for further diagnostic and hopefully therapeutic purposes.  We will keep myself and Albina Hull updated over the coming days to weeks to see what degree of improvement he receives.  Discussed paying attention to both short  and longer term improvement of his pain to help guide further diagnostic treatment.  If this is helpful, I still am not sure if clipping the iliopsoas tendon would be a long-term solution given that he has reported weakness with hip flexion as well and not just pain.  I do think evaluation by Zigmund Hills would be helpful given her skill with dry needling and soft tissue treatments. It is possible that resultant scar tissue from the anterior hip replacement may be limiting and causing muscle inhibition of his psoas and anterior hip flexors.  I did provide her  information and would recommend a few treatments to see cumulative improvement. F/u as needed.  Follow-up: Return if symptoms worsen or fail to improve.   Meds & Orders: No orders of the defined types were placed in this encounter.   Orders Placed This Encounter  Procedures   US  Extrem Low Left Ltd     Procedures:     US -guided Iliopsoas Tendon Sheath Injection, Left Hip: After discussion on risk/benefits/indications, an informed verbal consent was obtained. A timeout was then performed. The patient was lying supine on examination table with the affected leg relaxed in neutral position. The area overlying the groin and psoas tendon was prepped with ChloraPrep and multiple alcohol swabs . The ultrasound probe was placed in an oblique plane parallel to the inguinal ligament and superior to femoral head. The overlying soft tissue was anesthesized with 4 cc of lidocaine  1%  Using ultrasound guidance via an in-plane approach, a 22-gauge, 3.5" needle was inserted from a lateral to medial direction into the iliopsoas tendon sheath between the tendon and ilium. The tendon sheath was then injected with a mixture of 1:1:1cc of lidocaine :bupivicaine:celestone . Appropriate spread of the injectate within the tendon sheath was visualized with ultrasound guidance. Patient tolerated the procedure well without immediate complications.  A Band-Aid was then applied.     Clinical History: No specialty comments available.  He reports that he has never smoked. He has quit using smokeless tobacco.  Recent Labs    12/24/22 1145 08/12/23 1444  HGBA1C 6.6* 6.7*    Objective:    Physical Exam  Gen: Well-appearing, in no acute distress; non-toxic CV: Well-perfused. Warm.  Resp: Breathing unlabored on room air; no wheezing. Psych: Fluid speech in conversation; appropriate affect; normal thought process  Ortho Exam - Left hip: There is a well-healed anterior incision from prior THA.  Anterior and lateral to  the incision there is a more prominent area of soft tissue compared to the contralateral hip.  There is no bulge in the inguinal crease however.  There is tenderness to palpation with deep pressure in the inguinal crease now in the area of the iliopsoas.  No restriction with internal or external logroll.  There is pain as well as pain secondary to weakness with Stinchfield and resisted hip flexion.  No considerable difference in the neutral versus internal/external position about the hip.  There is no considerable leg length difference in his supine position.  Evaluation of gait does show a mild antalgic gait with delayed hip flexion on the left compared to right side.  He has good strength with hip abduction bilaterally.  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   EXAM: MR OF THE LEFT HIP WITHOUT CONTRAST   TECHNIQUE: Multiplanar, multisequence MR imaging was performed. No intravenous contrast was administered.   COMPARISON:  Outside  radiographs from 11/05/2020 and 02/12/2021   FINDINGS: The left hip prosthesis appears to be in good position without obvious complicating features. No MR findings suspicious for periprosthetic fracture, stress fracture or loosening. No abnormal periarticular fluid collections are identified.   Looking at the outside radiographs there is an area of progressive heterotopic ossification between the superolateral acetabulum and the greater trochanter. This is difficult to see on the MRI.   The pubic symphysis and SI joints are intact. No pelvic stress or insufficiency fractures are identified. Mild right SI joint degenerative changes.   The hip and pelvic musculature are unremarkable. No muscle tear, myositis or mass. No fatty atrophy.   There is a longitudinal split type tear versus bifid iliopsoas tendon. I believe this is more likely a  bifid tendon with some interposed fat on the T1 weighted images and I believe that it looks similar on the other side on the coronal sequences. There may be some inflammation of the distal left iliopsoas tendon but no tendon rupture.   No significant intrapelvic abnormalities are identified. No inguinal mass or hernia.   IMPRESSION: 1. Intact left hip prosthesis without obvious complicating features. 2. No abnormal periarticular fluid collections. 3. Longitudinal split type tear versus more likely bifid iliopsoas tendon. There may be some inflammation of the distal iliopsoas tendon but no tendon rupture. 4. No significant intrapelvic abnormalities.     Electronically Signed   By: Marrian Siva M.D.   On: 05/14/2021 16:35    Past Medical/Family/Surgical/Social History: Medications & Allergies reviewed per EMR, new medications updated. Patient Active Problem List   Diagnosis Date Noted   Diabetes mellitus without complication (HCC) 12/09/2020   Unilateral primary osteoarthritis, left hip 07/21/2020   PCP NOTES >>>>>>>>>>>> 08/29/2016   Annual physical exam 08/27/2016   Adhesive capsulitis of right shoulder 06/06/2015   Labyrinthitis 08/26/2013   Dysfunction of both eustachian tubes 08/26/2013   Allergic rhinitis 08/26/2013   GERD 10/17/2009   RECTAL BLEEDING 10/17/2009   REFLUX ESOPHAGITIS, HX OF 10/17/2009   Past Medical History:  Diagnosis Date   Adhesive capsulitis of right shoulder 06/06/2015   Allergic rhinitis    Diabetes (HCC)    GERD (gastroesophageal reflux disease)    Internal hemorrhoid    by colonoscopy 2011   Family History  Problem Relation Age of Onset   Diabetes Father    CAD Father 40   Lung cancer Father 83       smoker   Cancer Maternal Grandfather        colon   Cancer Paternal Aunt        colon   Prostate cancer Neg Hx    Past Surgical History:  Procedure Laterality Date   EYE SURGERY     x 2   HIP SURGERY Left    labrum repair ,  arthroscopy   SHOULDER ARTHROSCOPY Right 06/06/2015   Procedure: RIGHT SHOULDER ARTHROSCOPY WITH EXTENSIVE LABRAL DEBRIDEMENT AND MANIPULATION;  Surgeon: Osa Blase, MD;  Location: [facility];  Service: Orthopedics;  Laterality: Right;   TOTAL HIP ARTHROPLASTY Left 08/15/2020   Procedure: LEFT TOTAL HIP ARTHROPLASTY ANTERIOR APPROACH;  Surgeon: Arnie Lao, MD;  Location: [facility];  Service: Orthopedics;  Laterality: Left;   Social History   Occupational History   Occupation: MD  Tobacco Use   Smoking status: Never   Smokeless tobacco: Former   Tobacco comments:    chews   Vaping Use   Vaping status: Never Used  Substance  and Sexual Activity   Alcohol use: Yes    Comment: occas   Drug use: No   Sexual activity: Yes    Partners: Female

## 2023-12-02 ENCOUNTER — Encounter: Payer: Self-pay | Admitting: Sports Medicine

## 2024-01-20 ENCOUNTER — Encounter: Payer: Self-pay | Admitting: Sports Medicine

## 2024-03-02 ENCOUNTER — Other Ambulatory Visit (HOSPITAL_COMMUNITY): Payer: Self-pay

## 2024-03-02 ENCOUNTER — Other Ambulatory Visit: Payer: Self-pay

## 2024-03-02 ENCOUNTER — Other Ambulatory Visit (HOSPITAL_COMMUNITY): Payer: Self-pay | Admitting: Neurosurgery

## 2024-03-02 DIAGNOSIS — M5440 Lumbago with sciatica, unspecified side: Secondary | ICD-10-CM

## 2024-03-02 DIAGNOSIS — M25559 Pain in unspecified hip: Secondary | ICD-10-CM

## 2024-03-02 MED ORDER — FLUZONE 0.5 ML IM SUSY
0.5000 mL | PREFILLED_SYRINGE | Freq: Once | INTRAMUSCULAR | 0 refills | Status: AC
  Filled 2024-03-02: qty 0.5, 1d supply, fill #0

## 2024-03-08 ENCOUNTER — Ambulatory Visit (HOSPITAL_COMMUNITY)
Admission: RE | Admit: 2024-03-08 | Discharge: 2024-03-08 | Disposition: A | Discharge status: Home / Self Care | Source: Ambulatory Visit | Attending: Neurosurgery | Admitting: Neurosurgery

## 2024-03-08 DIAGNOSIS — M79605 Pain in left leg: Secondary | ICD-10-CM

## 2024-03-08 DIAGNOSIS — M5440 Lumbago with sciatica, unspecified side: Secondary | ICD-10-CM | POA: Diagnosis not present

## 2024-03-08 DIAGNOSIS — M47816 Spondylosis without myelopathy or radiculopathy, lumbar region: Secondary | ICD-10-CM | POA: Diagnosis not present

## 2024-03-08 DIAGNOSIS — M25559 Pain in unspecified hip: Secondary | ICD-10-CM | POA: Diagnosis not present

## 2024-03-21 ENCOUNTER — Encounter: Payer: Self-pay | Admitting: Internal Medicine

## 2024-03-21 DIAGNOSIS — E119 Type 2 diabetes mellitus without complications: Secondary | ICD-10-CM | POA: Diagnosis not present

## 2024-03-21 DIAGNOSIS — H53143 Visual discomfort, bilateral: Secondary | ICD-10-CM | POA: Diagnosis not present

## 2024-03-21 LAB — HM DIABETES EYE EXAM

## 2024-03-28 ENCOUNTER — Other Ambulatory Visit: Payer: Self-pay | Admitting: Orthopaedic Surgery

## 2024-03-28 ENCOUNTER — Other Ambulatory Visit: Payer: Self-pay | Admitting: Internal Medicine

## 2024-03-28 ENCOUNTER — Encounter: Payer: Self-pay | Admitting: Internal Medicine

## 2024-03-28 DIAGNOSIS — E785 Hyperlipidemia, unspecified: Secondary | ICD-10-CM

## 2024-03-28 DIAGNOSIS — E119 Type 2 diabetes mellitus without complications: Secondary | ICD-10-CM

## 2024-03-29 ENCOUNTER — Other Ambulatory Visit (HOSPITAL_COMMUNITY): Payer: Self-pay

## 2024-03-29 ENCOUNTER — Other Ambulatory Visit: Payer: Self-pay

## 2024-03-29 MED ORDER — MELOXICAM 15 MG PO TABS
15.0000 mg | ORAL_TABLET | Freq: Every day | ORAL | 3 refills | Status: DC | PRN
  Filled 2024-03-29: qty 30, 30d supply, fill #0

## 2024-03-29 MED ORDER — REPATHA SURECLICK 140 MG/ML ~~LOC~~ SOAJ
1.0000 mL | SUBCUTANEOUS | 3 refills | Status: AC
  Filled 2024-03-29 – 2024-07-18 (×3): qty 6, 84d supply, fill #0

## 2024-03-29 MED ORDER — EMPAGLIFLOZIN 10 MG PO TABS
10.0000 mg | ORAL_TABLET | Freq: Every day | ORAL | 1 refills | Status: DC
  Filled 2024-03-29: qty 90, 90d supply, fill #0

## 2024-03-29 MED ORDER — METFORMIN HCL 500 MG PO TABS
500.0000 mg | ORAL_TABLET | Freq: Two times a day (BID) | ORAL | 1 refills | Status: AC
  Filled 2024-03-29: qty 180, 90d supply, fill #0
  Filled 2024-07-17: qty 180, 90d supply, fill #1

## 2024-03-29 MED ORDER — MELOXICAM 15 MG PO TABS
15.0000 mg | ORAL_TABLET | Freq: Every day | ORAL | 1 refills | Status: AC | PRN
  Filled 2024-03-29 (×2): qty 90, 90d supply, fill #0
  Filled 2024-07-17: qty 90, 90d supply, fill #1

## 2024-04-02 ENCOUNTER — Other Ambulatory Visit (HOSPITAL_COMMUNITY): Payer: Self-pay

## 2024-04-16 ENCOUNTER — Encounter: Payer: Self-pay | Admitting: Radiology

## 2024-04-30 ENCOUNTER — Encounter (HOSPITAL_COMMUNITY): Payer: Self-pay

## 2024-04-30 ENCOUNTER — Other Ambulatory Visit (HOSPITAL_COMMUNITY): Payer: Self-pay

## 2024-04-30 ENCOUNTER — Other Ambulatory Visit

## 2024-04-30 ENCOUNTER — Other Ambulatory Visit: Payer: Self-pay

## 2024-04-30 DIAGNOSIS — N410 Acute prostatitis: Secondary | ICD-10-CM | POA: Diagnosis not present

## 2024-04-30 DIAGNOSIS — E785 Hyperlipidemia, unspecified: Secondary | ICD-10-CM

## 2024-04-30 DIAGNOSIS — E119 Type 2 diabetes mellitus without complications: Secondary | ICD-10-CM

## 2024-04-30 DIAGNOSIS — R399 Unspecified symptoms and signs involving the genitourinary system: Secondary | ICD-10-CM

## 2024-04-30 DIAGNOSIS — R310 Gross hematuria: Secondary | ICD-10-CM | POA: Diagnosis not present

## 2024-04-30 MED ORDER — AMOXICILLIN-POT CLAVULANATE 875-125 MG PO TABS
1.0000 | ORAL_TABLET | Freq: Two times a day (BID) | ORAL | 0 refills | Status: AC
  Filled 2024-04-30: qty 28, 14d supply, fill #0

## 2024-04-30 MED ORDER — ALFUZOSIN HCL ER 10 MG PO TB24
10.0000 mg | ORAL_TABLET | Freq: Every day | ORAL | 5 refills | Status: AC
  Filled 2024-04-30 – 2024-05-04 (×4): qty 30, 30d supply, fill #0

## 2024-04-30 NOTE — Progress Notes (Signed)
 Received message from PCP- Pt coming by lab to get BMP, CBC, A1c, UA and urine culture.

## 2024-05-04 ENCOUNTER — Other Ambulatory Visit (HOSPITAL_COMMUNITY): Payer: Self-pay

## 2024-06-19 ENCOUNTER — Other Ambulatory Visit (HOSPITAL_COMMUNITY): Payer: Self-pay

## 2024-06-19 MED ORDER — FINASTERIDE 5 MG PO TABS
5.0000 mg | ORAL_TABLET | Freq: Every day | ORAL | 3 refills | Status: AC
  Filled 2024-06-19: qty 90, 90d supply, fill #0

## 2024-06-20 ENCOUNTER — Other Ambulatory Visit (HOSPITAL_COMMUNITY): Payer: Self-pay

## 2024-07-02 ENCOUNTER — Other Ambulatory Visit (HOSPITAL_COMMUNITY): Payer: Self-pay

## 2024-07-17 ENCOUNTER — Other Ambulatory Visit: Payer: Self-pay | Admitting: Internal Medicine

## 2024-07-17 ENCOUNTER — Other Ambulatory Visit: Payer: Self-pay

## 2024-07-17 ENCOUNTER — Encounter: Payer: Self-pay | Admitting: Pharmacist

## 2024-07-17 ENCOUNTER — Other Ambulatory Visit (HOSPITAL_COMMUNITY): Payer: Self-pay

## 2024-07-17 ENCOUNTER — Encounter: Payer: Self-pay | Admitting: Internal Medicine

## 2024-07-17 MED ORDER — PREDNISONE 10 MG PO TABS
ORAL_TABLET | ORAL | 0 refills | Status: AC
  Filled 2024-07-17: qty 20, 8d supply, fill #0

## 2024-07-17 MED ORDER — CYCLOBENZAPRINE HCL 10 MG PO TABS
10.0000 mg | ORAL_TABLET | Freq: Two times a day (BID) | ORAL | 1 refills | Status: AC | PRN
  Filled 2024-07-17: qty 40, 20d supply, fill #0

## 2024-07-18 ENCOUNTER — Telehealth (HOSPITAL_COMMUNITY): Payer: Self-pay

## 2024-07-18 ENCOUNTER — Other Ambulatory Visit (HOSPITAL_BASED_OUTPATIENT_CLINIC_OR_DEPARTMENT_OTHER): Payer: Self-pay

## 2024-07-18 ENCOUNTER — Other Ambulatory Visit (HOSPITAL_COMMUNITY): Payer: Self-pay

## 2024-07-18 ENCOUNTER — Other Ambulatory Visit: Payer: Self-pay

## 2024-07-18 NOTE — Telephone Encounter (Signed)
 PA request has been Received. New Encounter has been or will be created for follow up. For additional info see Pharmacy Prior Auth telephone encounter from 07/18/24.

## 2024-07-18 NOTE — Telephone Encounter (Signed)
 Pharmacy Patient Advocate Encounter   Received notification from Pt Calls Messages that prior authorization for Repatha  SureClick 140MG /ML auto-injectors  is required/requested.   Insurance verification completed.   The patient is insured through [facility].   Per test claim: PA required; PA submitted to above mentioned insurance via Latent Key/confirmation #/EOC [id number] Status is pending

## 2024-07-19 ENCOUNTER — Other Ambulatory Visit: Payer: Self-pay

## 2024-07-19 ENCOUNTER — Other Ambulatory Visit (HOSPITAL_BASED_OUTPATIENT_CLINIC_OR_DEPARTMENT_OTHER): Payer: Self-pay

## 2024-07-19 ENCOUNTER — Encounter: Payer: Self-pay | Admitting: Pharmacy Technician

## 2024-07-19 ENCOUNTER — Other Ambulatory Visit (HOSPITAL_COMMUNITY): Payer: Self-pay

## 2024-07-19 NOTE — Telephone Encounter (Signed)
 Pharmacy Patient Advocate Encounter  Received notification from [facility] that Prior Authorization for  Repatha  SureClick 140MG /ML auto-injectors  has been APPROVED from 07/18/24 to 07/17/25. Ran test claim, Copay is $50 with copay card. This test claim was processed through [facility]- copay amounts may vary at other pharmacies due to pharmacy/plan contracts, or as the patient moves through the different stages of their insurance plan.   PA #/Case ID/Reference #: [id number]

## 2024-08-15 ENCOUNTER — Encounter: Admitting: Internal Medicine
